# Patient Record
Sex: Female | Born: 1950
Health system: Southern US, Community
[De-identification: ages and names within clinical notes are randomized; demographics above are authoritative.]

## PROBLEM LIST (undated history)

## (undated) DIAGNOSIS — N8502 Endometrial intraepithelial neoplasia [EIN]: Secondary | ICD-10-CM

## (undated) DIAGNOSIS — R319 Hematuria, unspecified: Secondary | ICD-10-CM

## (undated) DIAGNOSIS — J45909 Unspecified asthma, uncomplicated: Secondary | ICD-10-CM

## (undated) DIAGNOSIS — E7800 Pure hypercholesterolemia, unspecified: Secondary | ICD-10-CM

## (undated) DIAGNOSIS — IMO0002 Reserved for concepts with insufficient information to code with codable children: Secondary | ICD-10-CM

## (undated) HISTORY — DX: Hematuria, unspecified: R31.9

## (undated) HISTORY — DX: Reserved for concepts with insufficient information to code with codable children: IMO0002

## (undated) HISTORY — DX: Pure hypercholesterolemia, unspecified: E78.00

## (undated) HISTORY — DX: Endometrial intraepithelial neoplasia (EIN): N85.02

## (undated) HISTORY — PX: TUBAL LIGATION: SHX77

## (undated) HISTORY — DX: Unspecified asthma, uncomplicated: J45.909

---

## 1970-01-11 HISTORY — PX: CHOLECYSTECTOMY: SHX55

## 2003-03-18 ENCOUNTER — Encounter: Admission: RE | Admit: 2003-03-18 | Discharge: 2003-03-18 | Payer: Self-pay | Admitting: Family Medicine

## 2003-05-15 ENCOUNTER — Other Ambulatory Visit: Admission: RE | Admit: 2003-05-15 | Discharge: 2003-05-15 | Payer: Self-pay | Admitting: Family Medicine

## 2003-08-14 ENCOUNTER — Ambulatory Visit (HOSPITAL_COMMUNITY): Admission: RE | Admit: 2003-08-14 | Discharge: 2003-08-14 | Payer: Self-pay | Admitting: Gastroenterology

## 2004-08-10 ENCOUNTER — Other Ambulatory Visit: Admission: RE | Admit: 2004-08-10 | Discharge: 2004-08-10 | Payer: Self-pay | Admitting: Internal Medicine

## 2004-08-27 ENCOUNTER — Encounter: Admission: RE | Admit: 2004-08-27 | Discharge: 2004-08-27 | Payer: Self-pay | Admitting: Family Medicine

## 2005-08-30 ENCOUNTER — Encounter: Admission: RE | Admit: 2005-08-30 | Discharge: 2005-08-30 | Payer: Self-pay | Admitting: Family Medicine

## 2005-09-16 ENCOUNTER — Other Ambulatory Visit: Admission: RE | Admit: 2005-09-16 | Discharge: 2005-09-16 | Payer: Self-pay | Admitting: Family Medicine

## 2006-09-01 ENCOUNTER — Encounter: Admission: RE | Admit: 2006-09-01 | Discharge: 2006-09-01 | Payer: Self-pay | Admitting: Family Medicine

## 2006-10-20 ENCOUNTER — Other Ambulatory Visit: Admission: RE | Admit: 2006-10-20 | Discharge: 2006-10-20 | Payer: Self-pay | Admitting: Family Medicine

## 2007-09-20 ENCOUNTER — Encounter: Admission: RE | Admit: 2007-09-20 | Discharge: 2007-09-20 | Payer: Self-pay | Admitting: Family Medicine

## 2007-11-06 ENCOUNTER — Other Ambulatory Visit: Admission: RE | Admit: 2007-11-06 | Discharge: 2007-11-06 | Payer: Self-pay | Admitting: Family Medicine

## 2007-11-22 ENCOUNTER — Encounter: Admission: RE | Admit: 2007-11-22 | Discharge: 2007-11-22 | Payer: Self-pay | Admitting: Family Medicine

## 2008-01-26 ENCOUNTER — Ambulatory Visit (HOSPITAL_BASED_OUTPATIENT_CLINIC_OR_DEPARTMENT_OTHER): Admission: RE | Admit: 2008-01-26 | Discharge: 2008-01-26 | Payer: Self-pay | Admitting: Orthopedic Surgery

## 2008-11-13 ENCOUNTER — Encounter: Admission: RE | Admit: 2008-11-13 | Discharge: 2008-11-13 | Payer: Self-pay | Admitting: Family Medicine

## 2009-01-31 ENCOUNTER — Other Ambulatory Visit: Admission: RE | Admit: 2009-01-31 | Discharge: 2009-01-31 | Payer: Self-pay | Admitting: Family Medicine

## 2009-10-11 HISTORY — PX: DILATION AND CURETTAGE OF UTERUS: SHX78

## 2009-11-04 ENCOUNTER — Ambulatory Visit
Admission: RE | Admit: 2009-11-04 | Discharge: 2009-11-04 | Payer: Self-pay | Source: Home / Self Care | Admitting: Obstetrics & Gynecology

## 2009-11-20 ENCOUNTER — Encounter: Admission: RE | Admit: 2009-11-20 | Discharge: 2009-11-20 | Payer: Self-pay | Admitting: Family Medicine

## 2009-11-26 ENCOUNTER — Ambulatory Visit
Admission: RE | Admit: 2009-11-26 | Discharge: 2009-11-26 | Payer: Self-pay | Source: Home / Self Care | Admitting: Gynecologic Oncology

## 2009-12-11 HISTORY — PX: LAPAROSCOPIC TOTAL HYSTERECTOMY: SUR800

## 2009-12-16 ENCOUNTER — Ambulatory Visit (HOSPITAL_COMMUNITY)
Admission: RE | Admit: 2009-12-16 | Discharge: 2009-12-17 | Payer: Self-pay | Source: Home / Self Care | Attending: Obstetrics & Gynecology | Admitting: Obstetrics & Gynecology

## 2009-12-16 ENCOUNTER — Encounter: Payer: Self-pay | Admitting: Obstetrics & Gynecology

## 2010-02-04 ENCOUNTER — Ambulatory Visit
Admission: RE | Admit: 2010-02-04 | Discharge: 2010-02-04 | Payer: Self-pay | Source: Home / Self Care | Attending: Gynecologic Oncology | Admitting: Gynecologic Oncology

## 2010-02-09 NOTE — Consult Note (Signed)
NAMEVICTORINA, KABLE               ACCOUNT NO.:  [id number]  MEDICAL RECORD NO.:  [id number]          PATIENT TYPE:  OUT  LOCATION:  GYN                          FACILITY:  [facility]  PHYSICIAN:  Fraidy Mccarrick A. Duard Brady, MD    DATE OF BIRTH:  [birth year]  DATE OF CONSULTATION: DATE OF DISCHARGE:                                CONSULTATION   HISTORY OF PRESENT ILLNESS:  Ms. Haley Thompson is a very pleasant, 60 year old who had been on hormone replacement therapy and began having some spotting.  Endometrial biopsy revealed simple and complex hyperplasia with atypia.  She subsequently underwent a robotic hysterectomy, BSO and pelvic lymph nodes on December 16, 2009.  Pathology revealed a superficially invasive endometrioid adenocarcinoma arising in the background of atypical complex hyperplasia confined to within the inner half of the myometrium.  There was lymphovascular space involvement. The cervix, tubes and ovaries were negative.  Zero out of 7 lymph nodes were involved.  In reviewing her pathologic factors, she is 60 years old with a grade 1 lesion, approximately 12% myometrial invasion and 0 out of 7 lymph nodes with negative washings.  While she does have lymphovascular space involvement, based on GOG-99, she only has one of the required two risk factors.  She and I reviewed that at length today.  She did have some back pain postoperatively, but she thinks it might have been just her abdominal pain referring itself.  She only needed to take some ibuprofen for her pain, and it has done really well.  She states that since her surgery, while she was initially feeling some symptoms of bladder relaxation and not a complete voiding, she says since the surgery she feels that her bladder has improved, and she has increased forced to her urinary stream. She did have some vaginal bleeding but has had just a minimal pink discharge.  She did have a fairly profound amount of peritoneal fluid that did come out  on postoperative day #1 and that resolved quite quickly.  She otherwise is without complaints.  PHYSICAL EXAMINATION:  Weight 145 pounds, blood pressure 120/64, pulse 72.  Well-nourished, well-developed female in no acute distress. ABDOMEN:  Well-healed surgical incisions.  Abdomen is soft and nontender. PELVIC:  External genitalia is within normal limits, slightly atrophic. The vagina is atrophic.  The vaginal cuff is visualized.  Vaginal cuff sutures are still seen.  Bimanual examination reveals normal postoperative changes.  There is no masses or nodularity.  ASSESSMENT:  A 60 year old with stage IA grade 1 endometrioid adenocarcinoma who is doing very well postoperatively.  PLAN:  Since the sutures are still seen, I advised her to refrain from intercourse another 2 weeks.  She can start increasing her usual other activities.  She will need to see [place] in 6 months and either alternate between Dr. Kevan Ny and Dr. Hyacinth Meeker every 6 months.  She will be seen twice a year for the next 5 years.  The follow-up plans were discussed with the patient, and her questions were elicited and answered to her satisfaction.  She is very pleased with how she has done and is very pleased with her  pathology report as are we.     Garvis Downum A. Duard Brady, MD     PAG/MEDQ  D:  02/04/2010  T:  02/04/2010  Job:  [id number]  cc:   Duncan Dull, M.D. Fax: [phone]  Telford Nab, R.N. [address] [place], [place] [address]  M. Leda Quail, MD Fax: [phone]  Electronically Signed by Cleda Mccreedy MD on 02/09/2010 02:26:27 PM

## 2010-03-24 LAB — CBC
HCT: 34.1 % — ABNORMAL LOW (ref 36.0–46.0)
HCT: 39.4 % (ref 36.0–46.0)
Hemoglobin: 11.5 g/dL — ABNORMAL LOW (ref 12.0–15.0)
Hemoglobin: 13.3 g/dL (ref 12.0–15.0)
MCH: 30.7 pg (ref 26.0–34.0)
MCH: 30.9 pg (ref 26.0–34.0)
MCHC: 33.7 g/dL (ref 30.0–36.0)
MCHC: 33.8 g/dL (ref 30.0–36.0)
MCV: 91.2 fL (ref 78.0–100.0)
MCV: 91.4 fL (ref 78.0–100.0)
Platelets: 174 10*3/uL (ref 150–400)
Platelets: 218 10*3/uL (ref 150–400)
RBC: 3.74 MIL/uL — ABNORMAL LOW (ref 3.87–5.11)
RBC: 4.31 MIL/uL (ref 3.87–5.11)
RDW: 12.5 % (ref 11.5–15.5)
RDW: 12.6 % (ref 11.5–15.5)
WBC: 6.1 10*3/uL (ref 4.0–10.5)
WBC: 7.6 10*3/uL (ref 4.0–10.5)

## 2010-03-24 LAB — COMPREHENSIVE METABOLIC PANEL
ALT: 25 U/L (ref 0–35)
AST: 26 U/L (ref 0–37)
Albumin: 4.1 g/dL (ref 3.5–5.2)
Alkaline Phosphatase: 70 U/L (ref 39–117)
BUN: 17 mg/dL (ref 6–23)
CO2: 26 mEq/L (ref 19–32)
Calcium: 9.5 mg/dL (ref 8.4–10.5)
Chloride: 109 mEq/L (ref 96–112)
Creatinine, Ser: 0.79 mg/dL (ref 0.4–1.2)
GFR calc Af Amer: >60 mL/min (ref >60)
GFR calc non Af Amer: >60 mL/min (ref >60)
Glucose, Bld: 96 mg/dL (ref 70–99)
Potassium: 4.3 mEq/L (ref 3.5–5.1)
Sodium: 141 mEq/L (ref 135–145)
Total Bilirubin: 1.2 mg/dL (ref 0.3–1.2)
Total Protein: 6.8 g/dL (ref 6.0–8.3)

## 2010-03-24 LAB — DIFFERENTIAL
Basophils Absolute: 0 10*3/uL (ref 0.0–0.1)
Basophils Relative: 1 % (ref 0–1)
Eosinophils Absolute: 0.2 10*3/uL (ref 0.0–0.7)
Eosinophils Relative: 3 % (ref 0–5)
Lymphocytes Relative: 39 % (ref 12–46)
Lymphs Abs: 2.4 10*3/uL (ref 0.7–4.0)
Monocytes Absolute: 0.5 10*3/uL (ref 0.1–1.0)
Monocytes Relative: 9 % (ref 3–12)
Neutro Abs: 3 10*3/uL (ref 1.7–7.7)
Neutrophils Relative %: 49 % (ref 43–77)

## 2010-03-24 LAB — TYPE AND SCREEN
ABO/RH(D): O POS
Antibody Screen: NEGATIVE

## 2010-03-24 LAB — SURGICAL PCR SCREEN: Staphylococcus aureus: POSITIVE — AB

## 2010-03-24 LAB — ABO/RH: ABO/RH(D): O POS

## 2010-03-25 LAB — POCT HEMOGLOBIN-HEMACUE: Hemoglobin: 13.5 g/dL (ref 12.0–15.0)

## 2010-04-27 LAB — POCT HEMOGLOBIN-HEMACUE: Hemoglobin: 13.8 g/dL (ref 12.0–15.0)

## 2010-04-30 ENCOUNTER — Other Ambulatory Visit: Payer: Self-pay | Admitting: Family Medicine

## 2010-04-30 DIAGNOSIS — M8580 Other specified disorders of bone density and structure, unspecified site: Secondary | ICD-10-CM

## 2010-05-21 ENCOUNTER — Ambulatory Visit
Admission: RE | Admit: 2010-05-21 | Discharge: 2010-05-21 | Disposition: A | Discharge status: Home / Self Care | Payer: BC Managed Care – PPO | Source: Ambulatory Visit | Attending: Family Medicine | Admitting: Family Medicine

## 2010-05-21 DIAGNOSIS — M8580 Other specified disorders of bone density and structure, unspecified site: Secondary | ICD-10-CM

## 2010-05-26 NOTE — Op Note (Signed)
NAMELEI, DOWER               ACCOUNT NO.:  [id number]   MEDICAL RECORD NO.:  [id number]          PATIENT TYPE:  AMB   LOCATION:  DSC                          FACILITY:  [facility]   PHYSICIAN:  Katy Fitch. Sypher, M.D. DATE OF BIRTH:  07-02-1950   DATE OF PROCEDURE:  01/26/2008  DATE OF DISCHARGE:                               OPERATIVE REPORT   PREOPERATIVE DIAGNOSIS:  Mucoid cyst, left small finger, dorsal ulnar  aspect with advanced degenerative arthritis of distal interphalangeal  joint.   POSTOPERATIVE DIAGNOSIS:  Mucoid cyst, left small finger, dorsal ulnar  aspect with advanced degenerative arthritis of distal interphalangeal  joint.   OPERATION:  1. Debridement of left small finger distal interphalangeal joint with      removal of marginal osteophytes on the middle phalangeal head and      the base of the distal phalanx followed by synovectomy and removal      of loose bodies.  2. Removal of subcutaneous mucoid cyst.   OPERATING SURGEON:  Katy Fitch. Sypher, MD   ASSISTANT:  Marveen Reeks Dasnoit, PA-C   ANESTHESIA:  General sedation and 2% lidocaine metacarpal head level  block, left small finger.   SUPERVISING ANESTHESIOLOGIST:  Kaylyn Layer. Michelle Piper, MD   INDICATIONS:  Haley Thompson is a 60 year old woman who is referred for  evaluation of an enlarging mucoid cyst.  Clinical examination revealed  signs of significant degenerative arthritis involving her left small  finger DIP joint with bone-on-bone arthropathy and a multilobular mucoid  cyst.   We advised her 2 months prior to consider joint debridement and cyst  excision.   After contemplating this predicament for several months, she elected to  proceed with joint debridement and cyst excision at this time.   PROCEDURE IN DETAIL:  Gerardo Territo was brought to the operating room  and placed in supine position on the operating table.   Following sedation, the right arm was prepped with Betadine soap and  solution and  sterilely draped.  A pneumatic tourniquet was not utilized,  rather a Penrose drain was placed on the finger.   After routine draping, the right small finger was exsanguinated with a  gauze wrap and a 1/4-inch Penrose drain was placed in the proximal  phalangeal segment as digital tourniquet.   Procedure commenced with curvilinear incisions exposing the dorsal  radial and dorsal ulnar aspects of the DIP joint.  Capsulotomies were  performed between the collateral ligaments and the terminal extensor  tendon slip.  Marginal osteophytes were debrided at the base of the  distal phalanx and the head of the middle phalanx, more notable on the  ulnar aspect than the radial aspect.  There was a large synovial mass  within the joint that was debrided with micro rongeur technique.  The  marginal osteophytes were then debrided removing the Heberden nodes  followed by irrigation of the joint with a 27-gauge needle.  The  subcutaneous cyst was then debrided thoroughly with a micro rongeur  removing the membrane and the mucinous contents.   Both wounds were then repaired with interrupted suture of 5-0  nylon.  There were no apparent complications.   [name] tolerated the surgery and anesthesia well.  She was  transferred to the recovery room with stable signs.  She will be  discharged to the care of her family with prescriptions for Vicodin 5 mg  1 p.o. q.4-6 h. p.r.n. pain, 20 tablets without refill and Keflex 500 mg  1 p.o. q.8 h. x4 days as a prophylactic antibiotic.      Katy Fitch Sypher, M.D.  Electronically Signed     RVS/MEDQ  D:  01/26/2008  T:  01/26/2008  Job:  [id number]

## 2010-05-29 NOTE — Op Note (Signed)
NAMEGILLIAN, Haley Thompson                           ACCOUNT NO.:  [id number]   MEDICAL RECORD NO.:  [id number]                   PATIENT TYPE:  AMB   LOCATION:  ENDO                                 FACILITY:  [facility]   PHYSICIAN:  John C. Madilyn Fireman, M.D.                 DATE OF BIRTH:  1950-01-14   DATE OF PROCEDURE:  08/14/2003  DATE OF DISCHARGE:                                 OPERATIVE REPORT   PROCEDURE:  Colonoscopy.   INDICATIONS FOR PROCEDURE:  Average-risk colon cancer screening in a 60-year-  old patient with no previous screening.   PROCEDURE:  The patient was placed in the left lateral decubitus position  and placed on the pulse monitor with continuous low flow oxygen delivered by  nasal cannula.  She was sedated with 62.5 mcg IV fentanyl and 6 mg IV  Versed.  The Olympus video colonoscope is inserted into the rectum and  advanced to the cecum, confirmed by transillumination at McBurney's point  and visualization of the ileocecal valve and appendiceal orifice.  Prep is  excellent.  The cecum, ascending, transverse, descending, and sigmoid colon  all appeared normal with no masses, polyps, diverticula, or other mucosal  abnormalities.  The rectum likewise appeared normal, and retroflexed view of  the anus revealed no obvious internal hemorrhoids.  The scope was then  withdrawn, and the patient returned to the recovery room in stable  condition.  She tolerated the procedure well, and there were no immediate  complications.   IMPRESSION:  Normal colonoscopy.   PLAN:  A sigmoidoscopy in five years.                                               John C. Madilyn Fireman, M.D.    JCH/MEDQ  D:  08/14/2003  T:  08/14/2003  Job:  [id number]   cc:   Duncan Dull, M.D.  [address]  [place]  [place] [address]  Fax: [phone]

## 2010-07-29 ENCOUNTER — Ambulatory Visit: Payer: BC Managed Care – PPO | Attending: Gynecologic Oncology | Admitting: Gynecologic Oncology

## 2010-07-29 DIAGNOSIS — N8502 Endometrial intraepithelial neoplasia [EIN]: Secondary | ICD-10-CM | POA: Insufficient documentation

## 2010-07-29 DIAGNOSIS — C549 Malignant neoplasm of corpus uteri, unspecified: Secondary | ICD-10-CM | POA: Insufficient documentation

## 2010-07-31 NOTE — Consult Note (Signed)
NAMEJOELLE, ROSWELL               ACCOUNT NO.:  [id number]  MEDICAL RECORD NO.:  [id number]  LOCATION:  GYN                          FACILITY:  [facility]  PHYSICIAN:  Brittnie Lewey A. Duard Brady, MD    DATE OF BIRTH:  1950-06-07  DATE OF CONSULTATION:  07/29/2010 DATE OF DISCHARGE:                                CONSULTATION   HISTORY OF PRESENT ILLNESS:  Ms. Garlock is a very pleasant 60 year old who had an endometrial biopsy after having some postmenopausal bleeding that revealed complex hyperplasia with atypia.  She underwent robotic hysterectomy, BSO and pelvic lymph node dissection in December 2011. Pathology revealed superficially invasive endometrioid adenocarcinoma arising in the background of hyperplasia with no obvious site and negative lymph nodes.  She was therefore a stage IA grade 1 endometrioid adenocarcinoma, disposition to close followup.  She comes in today for followup.   She states that she is overall doing quite well.  She continues to have a little bit of bladder issues.  Initially she felt that her bladder issues were much better after surgery but they have returned and she is going to be seeing Dr. Hyacinth Meeker to discuss options. She states she is not really sleeping well.  She was prescribed some Ambien by Dr. Kevan Ny.  She is trying not to use that.  She is walking about 2 miles per day and in retrospect when we talked about this, she states that the day she exercises she does feel that she sleeps better and now that she is retired she states there is really no reason she could not exercise more frequently to see if that helps with her sleep patterns.  She also admits to fairly poor sleep hygiene and that she takes catnaps or stays up too late now that she does not have the regimen of waking up to go to work.  She denies any change in bowel or bladder; any vaginal bleeding; any nausea, vomiting, fevers, chills, headaches or visual changes.  MEDICATION:  Medication list is  reviewed and is unchanged; includes Lipitor, Ambien p.r.n. and a baby aspirin.  HEALTH MAINTENANCE:  She is up-to-date on her mammograms.  PHYSICAL EXAMINATION:  VITAL SIGNS:  Weight 142 pounds, blood pressure 118/60, pulse 72, respirations 20, temperature 97.7. GENERAL:  A well-nourished, well-developed female, in no acute distress. NECK:  Supple.  There is no lymphadenopathy.  No thyromegaly. LUNGS:  Clear to auscultation bilaterally. CARDIOVASCULAR:  Regular rate and rhythm. ABDOMEN:  Soft, nontender, nondistended.  There are no palpable masses or hepatosplenomegaly.  Groins are negative for adenopathy. EXTREMITIES:  There is no edema. PELVIC:  External genitalia is within normal limits.  The vagina is slightly atrophic.  There is some descensus.  The vaginal cuff is without lesions.  Bimanual examination reveals no masses or nodularity. Rectal confirms.  ASSESSMENT:  This is a 60 year old with stage IA grade 1 endometrioid adenocarcinoma who is doing very well at her first surveillance visit.  PLAN:  She will follow up with Dr. Hyacinth Meeker in 6 months and she will have a Pap smear at that time.  I discussed with her that we will do Pap smear on a yearly visit.  She will be  seeing every 6 months.  She will return to see me in 1 year's time.     Alese Furniss A. Duard Brady, MD     PAG/MEDQ  D:  07/29/2010  T:  07/29/2010  Job:  [id number]  cc:   Duncan Dull, M.D. Fax: [phone]  Telford Nab, R.N. [address] [place], [place] [address]  M. Leda Quail, MD Fax: [phone]  Electronically Signed by Cleda Mccreedy MD on 07/31/2010 10:12:51 AM

## 2010-11-11 ENCOUNTER — Other Ambulatory Visit: Payer: Self-pay | Admitting: Family Medicine

## 2010-11-11 DIAGNOSIS — Z1231 Encounter for screening mammogram for malignant neoplasm of breast: Secondary | ICD-10-CM

## 2011-01-14 ENCOUNTER — Ambulatory Visit
Admission: RE | Admit: 2011-01-14 | Discharge: 2011-01-14 | Disposition: A | Discharge status: Home / Self Care | Payer: BC Managed Care – PPO | Source: Ambulatory Visit | Attending: Family Medicine | Admitting: Family Medicine

## 2011-01-14 DIAGNOSIS — Z1231 Encounter for screening mammogram for malignant neoplasm of breast: Secondary | ICD-10-CM

## 2011-07-21 ENCOUNTER — Encounter: Payer: Self-pay | Admitting: Gynecologic Oncology

## 2011-07-28 ENCOUNTER — Other Ambulatory Visit (HOSPITAL_COMMUNITY)
Admission: RE | Admit: 2011-07-28 | Discharge: 2011-07-28 | Disposition: A | Discharge status: Home / Self Care | Payer: BC Managed Care – PPO | Source: Ambulatory Visit | Attending: Gynecologic Oncology | Admitting: Gynecologic Oncology

## 2011-07-28 ENCOUNTER — Encounter: Payer: Self-pay | Admitting: Gynecologic Oncology

## 2011-07-28 ENCOUNTER — Ambulatory Visit: Payer: BC Managed Care – PPO | Attending: Gynecologic Oncology | Admitting: Gynecologic Oncology

## 2011-07-28 VITALS — BP 106/70 | HR 64 | Temp 97.5°F | Resp 16 | Ht 64.17 in | Wt 151.0 lb

## 2011-07-28 DIAGNOSIS — C541 Malignant neoplasm of endometrium: Secondary | ICD-10-CM | POA: Insufficient documentation

## 2011-07-28 DIAGNOSIS — Z124 Encounter for screening for malignant neoplasm of cervix: Secondary | ICD-10-CM | POA: Insufficient documentation

## 2011-07-28 DIAGNOSIS — Z7982 Long term (current) use of aspirin: Secondary | ICD-10-CM | POA: Insufficient documentation

## 2011-07-28 DIAGNOSIS — E7800 Pure hypercholesterolemia, unspecified: Secondary | ICD-10-CM | POA: Insufficient documentation

## 2011-07-28 DIAGNOSIS — Z833 Family history of diabetes mellitus: Secondary | ICD-10-CM | POA: Insufficient documentation

## 2011-07-28 DIAGNOSIS — Z01419 Encounter for gynecological examination (general) (routine) without abnormal findings: Secondary | ICD-10-CM | POA: Insufficient documentation

## 2011-07-28 DIAGNOSIS — Z803 Family history of malignant neoplasm of breast: Secondary | ICD-10-CM | POA: Insufficient documentation

## 2011-07-28 DIAGNOSIS — Z90710 Acquired absence of both cervix and uterus: Secondary | ICD-10-CM | POA: Insufficient documentation

## 2011-07-28 DIAGNOSIS — Z8249 Family history of ischemic heart disease and other diseases of the circulatory system: Secondary | ICD-10-CM | POA: Insufficient documentation

## 2011-07-28 DIAGNOSIS — Z8542 Personal history of malignant neoplasm of other parts of uterus: Secondary | ICD-10-CM | POA: Insufficient documentation

## 2011-07-28 DIAGNOSIS — Z79890 Hormone replacement therapy (postmenopausal): Secondary | ICD-10-CM | POA: Insufficient documentation

## 2011-07-28 NOTE — Progress Notes (Signed)
Consult Note: Gyn-Onc  Haley Thompson 61 y.o. female  CC:  Chief Complaint  Patient presents with  . Cancer    HPI: HISTORY OF PRESENT ILLNESS: Haley Thompson is a very pleasant, 61 year old who had been on hormone replacement therapy and began having some spotting. Endometrial biopsy revealed simple and complex hyperplasia with atypia. She subsequently underwent a robotic hysterectomy, BSO and pelvic lymph nodes on December 16, 2009. Pathology revealed a superficially invasive endometrioid adenocarcinoma arising in the  background of atypical complex hyperplasia confined to within the inner half of the myometrium. There was lymphovascular space involvement. The cervix, tubes and ovaries were negative. Zero out of 7 lymph nodes  were involved. In reviewing her pathologic factors, she was 61 years old at the time of diagnosis with a grade 1 lesion, approximately 12% myometrial invasion and 0 out of 7 lymph nodes with negative washings. While she does have lymphovascular space involvement, based on GOG-99, she only has one of the required two risk factors. She and I reviewed that at length at her post operative visit. We last saw her 7/12.  Interval History:  She is doing very well. She saw Dr. Hyacinth Meeker 6 months ago and had a negative examination and Pap smear. She is also up-to-date on her mammograms. She is followed regularly by Dr. Kevan Ny. She is sleeping better now that she is in regular sleep schedule. She only requires her Ambien about one to 2 times per week. She has been drinking more water on less so than coffee and that might be helping with her sleeping pattern. Dr. Hyacinth Meeker also has her seen a urologist undergoing biofeedback and some physical therapy exercises to help with her sense of urinary urgency and urge incontinence. With regards to family history up, her older brother who turned 2 this year has had some stents placed for coronary artery disease he is also had a stroke. He is a  smoker.  Review of Systems: She denies any nausea, vomiting, fevers, chills, chest pain, shortness of breath. She denies any vaginal discharge or change in her bowel or bladder habits other than mentioned above. She denies any abdominal or pelvic pain. Review of systems x10 systems is otherwise negative. Current Meds:  Outpatient Encounter Prescriptions as of 07/28/2011  Medication Sig Dispense Refill  . aspirin 81 MG tablet Take 81 mg by mouth daily.      . Atorvastatin Calcium (LIPITOR PO) Take by mouth.      . Zolpidem Tartrate (AMBIEN PO) Take by mouth as needed.        Allergy: No Known Allergies  Social Hx:   History   Social History  . Marital Status: Married    Spouse Name: N/A    Number of Children: N/A  . Years of Education: N/A   Occupational History  . Not on file.   Social History Main Topics  . Smoking status: Not on file  . Smokeless tobacco: Not on file  . Alcohol Use: Not on file  . Drug Use: Not on file  . Sexually Active: Not on file   Other Topics Concern  . Not on file   Social History Narrative  . No narrative on file    Past Surgical Hx:  Past Surgical History  Procedure Date  . Abdominal hysterectomy 12/2009    Robotic, BSO, pelvic LND  . Cholecystectomy 1972    Open  . Tubal ligation   . Dilation and curettage of uterus 10/2009    Past Medical  Hx:  Past Medical History  Diagnosis Date  . Postmenopausal bleeding   . Complex endometrial hyperplasia with atypia   . Endometrioid adenocarcinoma     stage IA, grade 1  . Hypercholesterolemia     Family Hx:  Family History  Problem Relation Age of Onset  . Heart attack Mother   . Hypertension Father   . Hypertension Sister   . Coronary artery disease Sister   . Hypertension Brother   . Coronary artery disease Brother   . Diabetes Maternal Aunt   . Breast cancer Paternal Aunt   . Coronary artery disease Maternal Grandfather   . Diabetes Paternal Grandmother     Vitals:  There  were no vitals taken for this visit.  Physical Exam: Well-nourished, well-developed female in no acute distress.  Neck: Supple, no lymphadenopathy, no thyromegaly.  Cardiovascular: Regular rate and rhythm.  Abdomen: Soft, nontender, nondistended. There are no palpable masses or hepatosplenomegaly. There is no evidence of any incisional hernias.  Extremities: No edema.  Pelvic: Normal external female genitalia. The vagina is atrophic. The vaginal cuff is visualized are no visible lesions. ThinPrep Pap was submitted without difficulty. Bimanual examination reveals no masses or nodularity. Rectal confirms.  Assessment/Plan:  61 year old with stage IA grade 1 endometrioid adenocarcinoma diagnosed and treated 1/12 who we last saw 7/12. We will followup in results for Pap smear from today. She will see Dr. Hyacinth Meeker in 6 months in followup with Dr. Kevan Ny has scheduled.    Miia Blanks A., MD 07/28/2011, 9:24 AM

## 2011-07-28 NOTE — Patient Instructions (Signed)
RTC one year

## 2011-08-03 ENCOUNTER — Telehealth: Payer: Self-pay | Admitting: Gynecologic Oncology

## 2011-08-03 NOTE — Telephone Encounter (Signed)
Message left for patient with pap smear results: negative.  Instructed to call for any questions or concerns.  

## 2012-01-26 ENCOUNTER — Other Ambulatory Visit (HOSPITAL_COMMUNITY): Payer: Self-pay | Admitting: Family Medicine

## 2012-01-26 DIAGNOSIS — Z1231 Encounter for screening mammogram for malignant neoplasm of breast: Secondary | ICD-10-CM

## 2012-03-08 ENCOUNTER — Other Ambulatory Visit: Payer: Self-pay | Admitting: Family Medicine

## 2012-03-08 DIAGNOSIS — R109 Unspecified abdominal pain: Secondary | ICD-10-CM

## 2012-03-09 ENCOUNTER — Ambulatory Visit
Admission: RE | Admit: 2012-03-09 | Discharge: 2012-03-09 | Disposition: A | Discharge status: Home / Self Care | Payer: BC Managed Care – PPO | Source: Ambulatory Visit | Attending: Family Medicine | Admitting: Family Medicine

## 2012-03-09 DIAGNOSIS — R109 Unspecified abdominal pain: Secondary | ICD-10-CM

## 2012-03-09 MED ORDER — IOHEXOL 300 MG/ML  SOLN
100.0000 mL | Freq: Once | INTRAMUSCULAR | Status: AC | PRN
  Administered 2012-03-09: 100 mL via INTRAVENOUS

## 2012-03-16 ENCOUNTER — Ambulatory Visit (HOSPITAL_COMMUNITY): Payer: BC Managed Care – PPO

## 2012-03-17 ENCOUNTER — Other Ambulatory Visit (HOSPITAL_COMMUNITY): Payer: Self-pay | Admitting: Family Medicine

## 2012-03-17 DIAGNOSIS — Z1231 Encounter for screening mammogram for malignant neoplasm of breast: Secondary | ICD-10-CM

## 2012-03-30 ENCOUNTER — Ambulatory Visit (HOSPITAL_COMMUNITY): Payer: BC Managed Care – PPO

## 2012-03-30 ENCOUNTER — Ambulatory Visit (HOSPITAL_COMMUNITY)
Admission: RE | Admit: 2012-03-30 | Discharge: 2012-03-30 | Disposition: A | Discharge status: Home / Self Care | Payer: BC Managed Care – PPO | Source: Ambulatory Visit | Attending: Family Medicine | Admitting: Family Medicine

## 2012-03-30 DIAGNOSIS — Z1231 Encounter for screening mammogram for malignant neoplasm of breast: Secondary | ICD-10-CM

## 2012-09-14 ENCOUNTER — Ambulatory Visit: Payer: BC Managed Care – PPO | Admitting: Gynecologic Oncology

## 2012-09-21 ENCOUNTER — Ambulatory Visit: Payer: BC Managed Care – PPO | Attending: Gynecologic Oncology | Admitting: Gynecologic Oncology

## 2012-09-21 ENCOUNTER — Encounter: Payer: Self-pay | Admitting: Gynecologic Oncology

## 2012-09-21 VITALS — BP 119/79 | HR 78 | Temp 98.6°F | Resp 16

## 2012-09-21 DIAGNOSIS — Z79899 Other long term (current) drug therapy: Secondary | ICD-10-CM | POA: Insufficient documentation

## 2012-09-21 DIAGNOSIS — C549 Malignant neoplasm of corpus uteri, unspecified: Secondary | ICD-10-CM | POA: Insufficient documentation

## 2012-09-21 DIAGNOSIS — E7800 Pure hypercholesterolemia, unspecified: Secondary | ICD-10-CM | POA: Insufficient documentation

## 2012-09-21 DIAGNOSIS — Z9079 Acquired absence of other genital organ(s): Secondary | ICD-10-CM | POA: Insufficient documentation

## 2012-09-21 DIAGNOSIS — C541 Malignant neoplasm of endometrium: Secondary | ICD-10-CM

## 2012-09-21 DIAGNOSIS — Z90710 Acquired absence of both cervix and uterus: Secondary | ICD-10-CM | POA: Insufficient documentation

## 2012-09-21 NOTE — Patient Instructions (Addendum)
Follow up as scheduled.  

## 2012-09-21 NOTE — Progress Notes (Signed)
Consult Note: Gyn-Onc  Haley Thompson 62 y.o. female  CC:  Chief Complaint  Patient presents with  . Endo Ca    HPI: Haley Thompson is a very pleasant, 62 year old who had been on hormone replacement therapy and began having some spotting. Endometrial biopsy revealed simple and complex hyperplasia with atypia. She subsequently underwent a robotic hysterectomy, BSO and pelvic lymph nodes on December 16, 2009. Pathology revealed a superficially invasive endometrioid adenocarcinoma arising in the background of atypical complex hyperplasia confined to within the inner half of the myometrium. There was lymphovascular space involvement. The cervix, tubes and ovaries were negative. Zero out of 7 lymph nodes   were involved. In reviewing her pathologic factors, she was 62 years old at the time of diagnosis with a grade 1 lesion, approximately 12% myometrial invasion and 0 out of 7 lymph nodes with negative washings. While she does have lymphovascular space involvement, based on GOG-99, she only has one of the required two risk factors. She and I reviewed that at length at her post operative visit. We last saw her 7/13.   Interval History:  She is doing very well. She saw Haley Thompson associate 6 months ago and had a negative examination and Pap smear. She is also up-to-date on her mammograms (3/14). She is followed regularly by Haley Thompson. She is sleeping better now that she is in regular sleep schedule. She only requires her Ambien about one to 2 times per week.Haley Thompson also had her see a urologist undergoing biofeedback and some physical therapy exercises to help with her sense of urinary urgency and urge incontinence. This is markedly better and she no longer has any stress urinary incontinence symptoms and only some urge incontinence. She was having some left lower quadrant pain when she was seen in Haley Thompson office. She followed up with Haley Thompson and had a CT scan in February of this year that was negative  for any evidence of metastatic disease or recurrence. This helped her symptoms could be related to diverticulosis. She is a colonoscopy due later this year. There were no new medical problems and her family.   Review of Systems:  Constitutional: Denies fever. Skin: No rash, sores, jaundice, itching, or dryness.  Cardiovascular: No chest pain, shortness of breath, or edema  Pulmonary: No cough or wheeze.  Gastro Intestinal: Reporting intermittent lower abdominal soreness on left as above.  No nausea, vomiting, constipation, or diarrhea reported. No bright red blood per rectum or change in bowel movement.  Genitourinary: No frequency, urgency, or dysuria.  Denies vaginal bleeding and discharge.  Musculoskeletal: No myalgia, arthralgia, joint swelling or pain.  Neurologic: No weakness, numbness, or change in gait.  Psychology: Sleep issues persist but are improved needing ambien only 1 time per week.   Current Meds:  Outpatient Encounter Prescriptions as of 09/21/2012  Medication Sig Dispense Refill  . aspirin 81 MG tablet Take 81 mg by mouth daily.      . Atorvastatin Calcium (LIPITOR PO) Take by mouth.      . Zolpidem Tartrate (AMBIEN PO) Take by mouth as needed.       No facility-administered encounter medications on file as of 09/21/2012.    Allergy: No Known Allergies  Social Hx:   History   Social History  . Marital Status: Married    Spouse Name: N/A    Number of Children: N/A  . Years of Education: N/A   Occupational History  . Not on file.   Social History  Main Topics  . Smoking status: Never Smoker   . Smokeless tobacco: Not on file  . Alcohol Use: No  . Drug Use: No  . Sexual Activity: Yes   Other Topics Concern  . Not on file   Social History Narrative  . No narrative on file    Past Surgical Hx:  Past Surgical History  Procedure Laterality Date  . Abdominal hysterectomy  12/2009    Robotic, BSO, pelvic LND  . Cholecystectomy  1972    Open  . Tubal  ligation    . Dilation and curettage of uterus  10/2009    Past Medical Hx:  Past Medical History  Diagnosis Date  . Postmenopausal bleeding   . Complex endometrial hyperplasia with atypia   . Endometrioid adenocarcinoma     stage IA, grade 1  . Hypercholesterolemia     Oncology Hx:    Endometrial adenocarcinoma   12/16/2009 Surgery Robotic hysterctomy, BSO and pelvic LND. Ia grade 1    Family Hx:  Family History  Problem Relation Age of Onset  . Heart attack Mother   . Hypertension Father   . Hypertension Sister   . Coronary artery disease Sister   . Hypertension Brother   . Coronary artery disease Brother   . Diabetes Maternal Aunt   . Breast cancer Paternal Aunt   . Coronary artery disease Maternal Grandfather   . Diabetes Paternal Grandmother     Vitals:  Blood pressure 119/79, pulse 78, temperature 98.6 F (37 C), temperature source Oral, resp. rate 16.  Physical Exam: Well-nourished, well-developed female in no acute distress.   Neck: Supple, no lymphadenopathy, no thyromegaly.   Cardiovascular: Regular rate and rhythm.   Lungs: Regular rate and rhythm.  Abdomen: Soft, nontender, nondistended. There are no palpable masses or hepatosplenomegaly. There is no evidence of any incisional hernias.   Extremities: No edema.   Pelvic: Normal external female genitalia. The vagina is atrophic. The vaginal cuff is visualized are no visible lesions. Bimanual examination reveals no masses or nodularity. Rectal confirms.   Assessment/Plan:  62 year old with stage IA grade 1 endometrioid adenocarcinoma diagnosed and treated 1/12 who we last saw 7/13. She has no evidence of disease on exam. She also had a CT scan in February of this year that was negative for metastatic disease. She will see Haley Thompson in 6 months in followup with Haley Thompson has scheduled.     Haley Thompson A., MD 09/21/2012, 9:31 AM

## 2012-09-22 ENCOUNTER — Other Ambulatory Visit: Payer: Self-pay

## 2013-03-05 ENCOUNTER — Encounter: Payer: Self-pay | Admitting: Obstetrics & Gynecology

## 2013-03-08 ENCOUNTER — Ambulatory Visit: Payer: Self-pay | Admitting: Obstetrics & Gynecology

## 2013-03-12 ENCOUNTER — Other Ambulatory Visit: Payer: Self-pay

## 2013-03-12 DIAGNOSIS — Z1231 Encounter for screening mammogram for malignant neoplasm of breast: Secondary | ICD-10-CM

## 2013-04-05 ENCOUNTER — Ambulatory Visit
Admission: RE | Admit: 2013-04-05 | Discharge: 2013-04-05 | Disposition: A | Discharge status: Home / Self Care | Payer: Self-pay | Source: Ambulatory Visit

## 2013-04-05 DIAGNOSIS — Z1231 Encounter for screening mammogram for malignant neoplasm of breast: Secondary | ICD-10-CM

## 2013-04-09 ENCOUNTER — Encounter: Payer: Self-pay | Admitting: Obstetrics & Gynecology

## 2013-04-09 ENCOUNTER — Ambulatory Visit (INDEPENDENT_AMBULATORY_CARE_PROVIDER_SITE_OTHER): Payer: BC Managed Care – PPO | Admitting: Obstetrics & Gynecology

## 2013-04-09 VITALS — BP 104/68 | HR 60 | Resp 12 | Ht 63.75 in | Wt 154.2 lb

## 2013-04-09 DIAGNOSIS — Z124 Encounter for screening for malignant neoplasm of cervix: Secondary | ICD-10-CM

## 2013-04-09 DIAGNOSIS — Z01419 Encounter for gynecological examination (general) (routine) without abnormal findings: Secondary | ICD-10-CM

## 2013-04-09 NOTE — Progress Notes (Signed)
63 y.o. G3P2 MarriedCaucasianF here for annual exam.  Doing well.  Husband planning on retiring in a year.  Goes to [place] every year and stays a week.  H/O TLH/BSO/LND 12/11 due to Grade 1 Adenocarcinoma of uterus.  Dr. Alycia Rossetti did her surgery.  No vaginal bleeding.    Saw Ileana Roup for incontinence and PT has really helped.    Sees Dr. Inda Merlin yearly.  She does all of her blood work.  Has appt in April.  Patient's last menstrual period was 01/11/1998.          Sexually active: yes  The current method of family planning is status post hysterectomy.    Exercising: yes  walking Smoker:  no  Health Maintenance: Pap:  03/07/12 WNL/negative HR HPV History of abnormal Pap:  Yes h/o IA grade I, adenoca of uterus MMG:  04/05/13 normal  Colonoscopy:  2005-repeat in 10 years, Dr. Inda Merlin will set up this for patient BMD:   2011 TDaP:  Up to date Screening Labs: PCP, Hb today: PCP, Urine today: PCP   reports that she has never smoked. She has never used smokeless tobacco. She reports that she does not drink alcohol or use illicit drugs.  Past Medical History  Diagnosis Date  . Postmenopausal bleeding   . Complex endometrial hyperplasia with atypia   . Endometrioid adenocarcinoma     stage IA, grade 1  . Hypercholesterolemia   . Asthma     mild    Past Surgical History  Procedure Laterality Date  . Abdominal hysterectomy  12/2009    Robotic, BSO, pelvic LND  . Cholecystectomy  1972    Open  . Tubal ligation    . Dilation and curettage of uterus  10/2009    Current Outpatient Prescriptions  Medication Sig Dispense Refill  . aspirin 81 MG tablet Take 81 mg by mouth daily.      . Atorvastatin Calcium (LIPITOR PO) Take 20 mg by mouth.       . B Complex Vitamins (VITAMIN B COMPLEX PO) Take by mouth daily.      Marland Kitchen BIOTIN PO Take by mouth.      . Probiotic Product ([place]) Take by mouth.      . Zolpidem Tartrate (AMBIEN PO) Take by mouth as needed.      . Albuterol  Sulfate (PROAIR HFA IN) Inhale into the lungs.      . Ciclopirox (PENLAC EX) Apply topically.       No current facility-administered medications for this visit.    Family History  Problem Relation Age of Onset  . Heart attack Mother   . Hypertension Father   . Hypertension Sister   . Coronary artery disease Sister   . Hypertension Brother   . Coronary artery disease Brother   . Diabetes Maternal Aunt   . Breast cancer Paternal Aunt   . Coronary artery disease Maternal Grandfather   . Diabetes Paternal Grandmother   . Diabetes Maternal Grandfather   . Diabetes Maternal Grandmother   . Diabetes Brother   . Depression Maternal Grandmother     ROS:  Pertinent items are noted in HPI.  Otherwise, a comprehensive ROS was negative.  Exam:   BP 104/68  Pulse 60  Resp 12  Ht 5' 3.75" (1.619 m)  Wt 154 lb 3.2 oz (69.945 kg)  BMI 26.68 kg/m2  LMP 01/11/1998  Weight change: stable  Height: 5' 3.75" (161.9 cm)  Ht Readings from Last 3 Encounters:  04/09/13 5' 3.75" (1.619 m)  07/28/11 5' 4.17" (1.63 m)    General appearance: alert, cooperative and appears stated age Head: Normocephalic, without obvious abnormality, atraumatic Neck: no adenopathy, supple, symmetrical, trachea midline and thyroid normal to inspection and palpation Lungs: clear to auscultation bilaterally Breasts: normal appearance, no masses or tenderness Heart: regular rate and rhythm Abdomen: soft, non-tender; bowel sounds normal; no masses,  no organomegaly Extremities: extremities normal, atraumatic, no cyanosis or edema Skin: Skin color, texture, turgor normal. No rashes or lesions Lymph nodes: Cervical, supraclavicular, and axillary nodes normal. No abnormal inguinal nodes palpated Neurologic: Grossly normal   Pelvic: External genitalia:  no lesions              Urethra:  normal appearing urethra with no masses, tenderness or lesions              Bartholins and Skenes: normal                 Vagina:  normal appearing vagina with normal color and discharge, no lesions              Cervix: absent              Pap taken: yes Bimanual Exam:  Uterus:  uterus absent              Adnexa: no mass, fullness, tenderness               Rectovaginal: Confirms               Anus:  normal sphincter tone, no lesions  A:  Well Woman with normal exam H/O Grade I Adenocarcinoma of endometrium, s/p TLH/BSO/LND 12/11 with Dr. Alycia Rossetti Cystocele with mild SUI--has done well since seeing Ileana Roup.  No longer interested in surgery.  P:   Mammogram yearly. pap smear today. Seeing Dr. Inda Merlin in April. return annually or prn  An After Visit Summary was printed and given to the patient.

## 2013-04-11 LAB — IPS PAP SMEAR ONLY

## 2013-11-12 ENCOUNTER — Encounter: Payer: Self-pay | Admitting: Obstetrics & Gynecology

## 2013-11-29 ENCOUNTER — Encounter: Payer: Self-pay | Admitting: Gynecologic Oncology

## 2013-11-29 ENCOUNTER — Ambulatory Visit: Payer: Medicare PPO | Attending: Gynecologic Oncology | Admitting: Gynecologic Oncology

## 2013-11-29 VITALS — BP 113/80 | HR 76 | Temp 97.8°F | Resp 16 | Ht 63.75 in | Wt 152.1 lb

## 2013-11-29 DIAGNOSIS — Z79899 Other long term (current) drug therapy: Secondary | ICD-10-CM | POA: Insufficient documentation

## 2013-11-29 DIAGNOSIS — Z90710 Acquired absence of both cervix and uterus: Secondary | ICD-10-CM | POA: Diagnosis not present

## 2013-11-29 DIAGNOSIS — Z9079 Acquired absence of other genital organ(s): Secondary | ICD-10-CM | POA: Insufficient documentation

## 2013-11-29 DIAGNOSIS — C541 Malignant neoplasm of endometrium: Secondary | ICD-10-CM

## 2013-11-29 DIAGNOSIS — K219 Gastro-esophageal reflux disease without esophagitis: Secondary | ICD-10-CM | POA: Insufficient documentation

## 2013-11-29 DIAGNOSIS — E780 Pure hypercholesterolemia: Secondary | ICD-10-CM | POA: Diagnosis not present

## 2013-11-29 DIAGNOSIS — Z90722 Acquired absence of ovaries, bilateral: Secondary | ICD-10-CM | POA: Diagnosis not present

## 2013-11-29 DIAGNOSIS — Z8542 Personal history of malignant neoplasm of other parts of uterus: Secondary | ICD-10-CM

## 2013-11-29 DIAGNOSIS — Z7982 Long term (current) use of aspirin: Secondary | ICD-10-CM | POA: Diagnosis not present

## 2013-11-29 NOTE — Progress Notes (Signed)
Consult Note: Gyn-Onc  Haley Thompson 63 y.o. female  CC:  Chief Complaint  Patient presents with  . endometrial adenocarcinoma    HPI: Haley Thompson is a very pleasant, 63 year old who had been on hormone replacement therapy and began having some spotting. Endometrial biopsy revealed simple and complex hyperplasia with atypia. She subsequently underwent a robotic hysterectomy, BSO and pelvic lymph nodes on December 16, 2009. Pathology revealed a superficially invasive endometrioid adenocarcinoma arising in the background of atypical complex hyperplasia confined to within the inner half of the myometrium. There was lymphovascular space involvement. The cervix, tubes and ovaries were negative. Zero out of 7 lymph nodes were involved.  In reviewing her pathologic factors, she was 63 years old at the time of diagnosis with a grade 1 lesion, approximately 12% myometrial invasion and 0 out of 7 lymph nodes with negative washings. While she does have lymphovascular space involvement, based on GOG-99, she only has one of the required two risk factors.  We last saw her 63.   Interval History:  She is doing very well. She saw Dr. Sabra Heck 6 months ago and had a negative examination and Pap smear. She is also up-to-date on her mammograms (3/14). She is followed regularly by Dr. Inda Merlin. She had a colonoscopy about 4 weeks ago was told she did not need another colonoscopy for 10 years. Left-sided pain is decreased significantly. We have discussed this last year. Of note she had a negative CT scan. With a negative colonoscopy is. It is most likely related to her diet. She was encouraged to take marrow laxative every day as she does have some issues with constipation with the Lipitor. She is having an increase in her acid reflux and is also making dietary modifications for that. She has lost about 3 pounds over the past year but that is been recent and is been intentional. 65 of her brothers have been diagnosed with  diabetes. One has insulin requiring diabetes. One is managing it with diet. She is up about 10 pounds from her presurgery weight and she is trying to get back to her baseline weight. She is otherwise without complaints.  Review of Systems:  Constitutional: Denies fever. Skin: No rash Cardiovascular: No chest pain, shortness of breath, or edema  Pulmonary: No cough or wheeze.  Gastro Intestinal: No nausea, vomiting, occ constipation, or diarrhea reported. No bright red blood per rectum or change in bowel movement.  Genitourinary: No frequency, urgency, or dysuria.  Denies vaginal bleeding and discharge.  Musculoskeletal: No myalgia, arthralgia, joint swelling or pain.  Neurologic: No weakness Psychology: No changes  Current Meds:  Outpatient Encounter Prescriptions as of 11/29/2013  Medication Sig  . Albuterol Sulfate (PROAIR HFA IN) Inhale into the lungs.  Marland Kitchen aspirin 81 MG tablet Take 81 mg by mouth daily.  . Atorvastatin Calcium (LIPITOR PO) Take 20 mg by mouth daily.   Marland Kitchen BIOTIN PO Take by mouth.  . zolpidem (AMBIEN) 10 MG tablet   . Probiotic Product ([place]) Take by mouth.  . [DISCONTINUED] B Complex Vitamins (VITAMIN B COMPLEX PO) Take by mouth daily.  . [DISCONTINUED] Ciclopirox (PENLAC EX) Apply topically.  . [DISCONTINUED] FLUZONE QUADRIVALENT injection   . [DISCONTINUED] GAVILYTE-N WITH FLAVOR PACK 420 G solution   . [DISCONTINUED] Zolpidem Tartrate (AMBIEN PO) Take by mouth as needed.    Allergy: No Known Allergies  Social Hx:   History   Social History  . Marital Status: Married    Spouse Name: N/A  Number of Children: N/A  . Years of Education: N/A   Occupational History  . Not on file.   Social History Main Topics  . Smoking status: Never Smoker   . Smokeless tobacco: Never Used  . Alcohol Use: No  . Drug Use: No  . Sexual Activity: Yes    Birth Control/ Protection: Surgical     Comment: TLH/BSO   Other Topics Concern  . Not on file    Social History Narrative    Past Surgical Hx:  Past Surgical History  Procedure Laterality Date  . Laparoscopic total hysterectomy  12/2009    with BSO, pelvic LND  . Cholecystectomy  1972  . Tubal ligation    . Dilation and curettage of uterus  10/2009    Past Medical Hx:  Past Medical History  Diagnosis Date  . Complex endometrial hyperplasia with atypia   . Endometrioid adenocarcinoma     stage IA, grade 1  . Hypercholesterolemia   . Asthma     mild    Oncology Hx:    Endometrial adenocarcinoma   12/16/2009 Surgery Robotic hysterctomy, BSO and pelvic LND. Ia grade 1    Family Hx:  Family History  Problem Relation Age of Onset  . Heart attack Mother   . Hypertension Father   . Hypertension Sister   . Coronary artery disease Sister   . Hypertension Brother   . Coronary artery disease Brother   . Diabetes Maternal Aunt   . Breast cancer Paternal Aunt   . Coronary artery disease Maternal Grandfather   . Diabetes Paternal Grandmother   . Diabetes Maternal Grandfather   . Diabetes Maternal Grandmother   . Diabetes Brother   . Depression Maternal Grandmother     Vitals:  Blood pressure 113/80, pulse 76, temperature 97.8 F (36.6 C), temperature source Oral, resp. rate 16, height 5' 3.75" (1.619 m), weight 152 lb 1.6 oz (68.992 kg), last menstrual period 01/11/1998.  Physical Exam: Well-nourished, well-developed female in no acute distress.   Neck: Supple, no lymphadenopathy, no thyromegaly.   Cardiovascular: Regular rate and rhythm.   Lungs: Regular rate and rhythm.  Abdomen: Soft, nontender, nondistended. There are no palpable masses or hepatosplenomegaly. There is no evidence of any incisional hernias.   Extremities: No edema.   Pelvic: Normal external female genitalia. The vagina is atrophic. The vaginal cuff is visualized are no visible lesions. Bimanual examination reveals no masses or nodularity. Rectal confirms.   Assessment/Plan:  63 year old  with stage IA grade 1 endometrioid adenocarcinoma diagnosed and treated 12/11 who we last saw 9/14. She has no evidence of disease on exam.She will see Dr. Sabra Heck in 6 months in followup with Dr. Inda Merlin has scheduled. She will return to see [place] for her last 5 year visit in one year's time. She knows that she can feel free to call us if she has any questions.     Ayrabella Labombard A., MD 11/29/2013, 9:57 AM

## 2013-11-29 NOTE — Addendum Note (Signed)
Addended by: Christa See on: 11/29/2013 10:14 AM   Modules accepted: Medications

## 2013-11-29 NOTE — Patient Instructions (Signed)
Please follow-up with Dr. Sabra Heck in 6 months. Return to see GYN oncology in one year. That will be your 5 year anniversary. Happy holidays!

## 2014-01-11 DIAGNOSIS — R319 Hematuria, unspecified: Secondary | ICD-10-CM

## 2014-01-11 HISTORY — DX: Hematuria, unspecified: R31.9

## 2014-03-26 ENCOUNTER — Other Ambulatory Visit (HOSPITAL_COMMUNITY): Payer: Self-pay | Admitting: Family Medicine

## 2014-03-26 ENCOUNTER — Other Ambulatory Visit: Payer: Self-pay

## 2014-03-26 DIAGNOSIS — Z1231 Encounter for screening mammogram for malignant neoplasm of breast: Secondary | ICD-10-CM

## 2014-04-11 ENCOUNTER — Ambulatory Visit
Admission: RE | Admit: 2014-04-11 | Discharge: 2014-04-11 | Disposition: A | Discharge status: Home / Self Care | Payer: Medicare PPO | Source: Ambulatory Visit

## 2014-04-11 DIAGNOSIS — Z1231 Encounter for screening mammogram for malignant neoplasm of breast: Secondary | ICD-10-CM

## 2014-04-25 ENCOUNTER — Ambulatory Visit (INDEPENDENT_AMBULATORY_CARE_PROVIDER_SITE_OTHER): Payer: Medicare PPO | Admitting: Obstetrics & Gynecology

## 2014-04-25 ENCOUNTER — Encounter: Payer: Self-pay | Admitting: Obstetrics & Gynecology

## 2014-04-25 VITALS — BP 124/74 | HR 60 | Resp 16 | Ht 64.0 in | Wt 157.8 lb

## 2014-04-25 DIAGNOSIS — N200 Calculus of kidney: Secondary | ICD-10-CM

## 2014-04-25 DIAGNOSIS — Z01419 Encounter for gynecological examination (general) (routine) without abnormal findings: Secondary | ICD-10-CM | POA: Diagnosis not present

## 2014-04-25 DIAGNOSIS — E2839 Other primary ovarian failure: Secondary | ICD-10-CM

## 2014-04-25 DIAGNOSIS — Z124 Encounter for screening for malignant neoplasm of cervix: Secondary | ICD-10-CM

## 2014-04-25 NOTE — Progress Notes (Signed)
64 y.o. G3P2 MarriedCaucasianF here for annual exam.  Doing well.  No vaginal bleeding.  Saw Dr. Alycia Rossetti 11/15.  Has one final visit this winter and she will be at her five year mark.    PCP:  Dr. Inda Merlin.  Last appointment was last month.  Pt recently had blood in her urine.  She had a small renal stone.   Patient's last menstrual period was 01/11/1998.          Sexually active: Yes.    The current method of family planning is status post hysterectomy.    Exercising: No.  not regularly Smoker:  no  Health Maintenance: Pap:  04/09/13 WNL-h/o endometrial cancer History of abnormal Pap:  yes MMG:  04/11/14 3D-normal Colonoscopy:  2016-repeat in 10 years, [facility] BMD:   2011 TDaP:  2013 with PCP Screening Labs: PCP, Hb today: PCP, Urine today: PCP and urologist   reports that she has never smoked. She has never used smokeless tobacco. She reports that she does not drink alcohol or use illicit drugs.  Past Medical History  Diagnosis Date  . Complex endometrial hyperplasia with atypia   . Endometrioid adenocarcinoma     stage IA, grade 1  . Hypercholesterolemia   . Asthma     mild  . Hematuria 2016    saw urologist-? passed small stone    Past Surgical History  Procedure Laterality Date  . Laparoscopic total hysterectomy  12/2009    with BSO, pelvic LND  . Cholecystectomy  1972  . Tubal ligation    . Dilation and curettage of uterus  10/2009    Current Outpatient Prescriptions  Medication Sig Dispense Refill  . Albuterol Sulfate (PROAIR HFA IN) Inhale into the lungs.    Marland Kitchen aspirin 81 MG tablet Take 81 mg by mouth daily.    . Atorvastatin Calcium (LIPITOR PO) Take 20 mg by mouth daily.     Marland Kitchen BIOTIN PO Take by mouth.    . Calcium Carb-Cholecalciferol (CALCIUM 600 + D) 600-200 MG-UNIT TABS Take 1 tablet by mouth daily.    . cholecalciferol (VITAMIN D) 1000 UNITS tablet Take 2,000 Units by mouth daily.    Marland Kitchen zolpidem (AMBIEN) 10 MG tablet     . Probiotic Product ([place]) Take by mouth.     No current facility-administered medications for this visit.    Family History  Problem Relation Age of Onset  . Heart attack Mother   . Hypertension Father   . Hypertension Sister   . Coronary artery disease Sister   . Hypertension Brother   . Coronary artery disease Brother   . Diabetes Maternal Aunt   . Breast cancer Paternal Aunt   . Coronary artery disease Maternal Grandfather   . Diabetes Paternal Grandmother   . Diabetes Maternal Grandfather   . Diabetes Maternal Grandmother   . Diabetes Brother   . Depression Maternal Grandmother     ROS:  Pertinent items are noted in HPI.  Otherwise, a comprehensive ROS was negative.  Exam:   General appearance: alert, cooperative and appears stated age Head: Normocephalic, without obvious abnormality, atraumatic Neck: no adenopathy, supple, symmetrical, trachea midline and thyroid normal to inspection and palpation Lungs: clear to auscultation bilaterally Breasts: normal appearance, no masses or tenderness Heart: regular rate and rhythm Abdomen: soft, non-tender; bowel sounds normal; no masses,  no organomegaly Extremities: extremities normal, atraumatic, no cyanosis or edema Skin: Skin color, texture, turgor normal. No rashes or lesions Lymph nodes:  Cervical, supraclavicular, and axillary nodes normal. No abnormal inguinal nodes palpated Neurologic: Grossly normal   Pelvic: External genitalia:  no lesions              Urethra:  normal appearing urethra with no masses, tenderness or lesions              Bartholins and Skenes: normal                 Vagina: normal appearing vagina with normal color and discharge, no lesions              Cervix: absent              Pap taken: Yes.   Bimanual Exam:  Uterus:  uterus absent              Adnexa: no mass, fullness, tenderness               Rectovaginal: Confirms               Anus:  normal sphincter tone, no lesions  Chaperone was present for  exam.  A:  Well Woman with normal exam H/O Grade I Adenocarcinoma of endometrium, s/p TLH/BSO/LND 12/11 with Dr. Alycia Rossetti Cystocele with mild SUI.  Has seen Ileana Roup in the past Recent hematuria with renal stone  P: Mammogram yearly pap smear today.  Pt will be seen yearly and she is aware of this. Seeing Dr. Inda Merlin in yearly.  Labs with her. return annually or prn

## 2014-04-27 LAB — IPS PAP SMEAR ONLY

## 2014-06-27 ENCOUNTER — Ambulatory Visit
Admission: RE | Admit: 2014-06-27 | Discharge: 2014-06-27 | Disposition: A | Discharge status: Home / Self Care | Payer: Medicare PPO | Source: Ambulatory Visit | Attending: Obstetrics & Gynecology | Admitting: Obstetrics & Gynecology

## 2014-06-27 DIAGNOSIS — E2839 Other primary ovarian failure: Secondary | ICD-10-CM

## 2014-06-27 DIAGNOSIS — N200 Calculus of kidney: Secondary | ICD-10-CM

## 2014-07-04 ENCOUNTER — Telehealth: Payer: Self-pay

## 2014-07-04 NOTE — Telephone Encounter (Signed)
Lmtcb//kn 

## 2014-07-04 NOTE — Telephone Encounter (Signed)
-----   Message from Megan Salon, MD sent at 07/03/2014  6:55 AM EDT ----- Please inform pt her BMD is very stable in her spine and there is a little change in her hip.  This is mild and I would recommend continuing to watch this.  Repeat BMD 3-4 years.  She is already on Calcium with Vit D.

## 2014-07-17 NOTE — Telephone Encounter (Signed)
Patient notified of results.//kn 

## 2014-10-16 ENCOUNTER — Ambulatory Visit: Payer: Medicare PPO | Attending: Gynecologic Oncology | Admitting: Gynecologic Oncology

## 2014-10-16 ENCOUNTER — Encounter: Payer: Self-pay | Admitting: Gynecologic Oncology

## 2014-10-16 VITALS — BP 122/73 | HR 64 | Temp 97.6°F | Resp 16 | Ht 64.0 in | Wt 150.4 lb

## 2014-10-16 DIAGNOSIS — C541 Malignant neoplasm of endometrium: Secondary | ICD-10-CM | POA: Diagnosis not present

## 2014-10-16 DIAGNOSIS — Z8542 Personal history of malignant neoplasm of other parts of uterus: Secondary | ICD-10-CM | POA: Diagnosis not present

## 2014-10-16 NOTE — Patient Instructions (Signed)
Happy 5 year anniversary! Congratulations! Please note that we will be happy to see you in the future should the need arise. Please follow-up with your other physicians as scheduled and see Dr. Sabra Heck in one year for routine gynecologic care.

## 2014-10-16 NOTE — Progress Notes (Signed)
Consult Note: Gyn-Onc  Haley Thompson 64 y.o. female  CC:  Chief Complaint  Patient presents with  . endometrial adenocarcinoma    follow-up    HPI: Haley Thompson is a very pleasant, 64 year old who had been on hormone replacement therapy and began having some spotting. Endometrial biopsy revealed simple and complex hyperplasia with atypia. She subsequently underwent a robotic hysterectomy, BSO and pelvic lymph nodes on December 16, 2009. Pathology revealed a superficially invasive endometrioid adenocarcinoma arising in the background of atypical complex hyperplasia confined to within the inner half of the myometrium. There was lymphovascular space involvement. The cervix, tubes and ovaries were negative. Zero out of 7 lymph nodes were involved.  In reviewing her pathologic factors, she was 64 years old at the time of diagnosis with a grade 1 lesion, approximately 12% myometrial invasion and 0 out of 7 lymph nodes with negative washings. While she does have lymphovascular space involvement, based on GOG-99, she only has one of the required two risk factors.  We last saw her 11/15.   Interval History:  She is doing very well. She saw Dr. Sabra Heck 6 months ago and had a negative examination and Pap smear. She is also up-to-date on her mammograms. She is followed regularly by Dr. Inda Merlin. She had a colonoscopy about on year ago was told she did not need another colonoscopy for 10 years. She's overall doing very well. She was seen by Dr. Inda Merlin in May for her annual and was also seen on time with Dr. Sabra Heck. She is up-to-date on her mammograms and her bone density studies. The left-sided pain she's having does appear to be dietary and related to her bowel habits. It is markedly improved. With some dietary changes she's lost weight and is having more regular bowel movements. She did have an episode of hematuria about 6 months ago. She was seen by urology and underwent a cystoscopy. They feel that she most likely  passed a small stone that was otherwise asymptomatic. She had no pain. No further workup is indicated at this time. She has another brother that was diagnosed with diabetes. There are no new cancers diagnosed in her family.  Review of Systems:  Constitutional: Denies fever. Skin: No rash Cardiovascular: No chest pain, shortness of breath, or edema  Pulmonary: No cough or wheeze.  Gastro Intestinal: No nausea, vomiting, occ constipation, no diarrhea reported. No bright red blood per rectum or change in bowel movement.  Genitourinary: No frequency, urgency, or dysuria.  Denies vaginal bleeding and discharge.  Musculoskeletal: No myalgia, arthralgia, joint swelling or pain.  Neurologic: No weakness Psychology: No changes  Current Meds:  Outpatient Encounter Prescriptions as of 10/16/2014  Medication Sig  . Albuterol Sulfate (PROAIR HFA IN) Inhale into the lungs as needed.   Marland Kitchen aspirin 81 MG tablet Take 81 mg by mouth daily.  Marland Kitchen atorvastatin (LIPITOR) 20 MG tablet Take 20 mg by mouth daily at 6 PM.   . BIOTIN PO Take 1 tablet by mouth daily.   . Calcium Carb-Cholecalciferol (CALCIUM 600 + D) 600-200 MG-UNIT TABS Take 1 tablet by mouth daily.  . cholecalciferol (VITAMIN D) 1000 UNITS tablet Take 2,000 Units by mouth daily.  . Probiotic Product ([place] [facility] PO) Take 1 tablet by mouth daily.   Marland Kitchen zolpidem (AMBIEN) 10 MG tablet Take 10 mg by mouth at bedtime as needed.   . [DISCONTINUED] Atorvastatin Calcium (LIPITOR PO) Take 20 mg by mouth daily.    No facility-administered encounter medications on file  as of 10/16/2014.    Allergy: No Known Allergies  Social Hx:   Social History   Social History  . Marital Status: Married    Spouse Name: N/A  . Number of Children: N/A  . Years of Education: N/A   Occupational History  . Not on file.   Social History Main Topics  . Smoking status: Never Smoker   . Smokeless tobacco: Never Used  . Alcohol Use: No  . Drug Use: No  .  Sexual Activity:    Partners: Male    Birth Control/ Protection: Surgical     Comment: TLH/BSO   Other Topics Concern  . Not on file   Social History Narrative    Past Surgical Hx:  Past Surgical History  Procedure Laterality Date  . Laparoscopic total hysterectomy  12/2009    with BSO, pelvic LND  . Cholecystectomy  1972  . Tubal ligation    . Dilation and curettage of uterus  10/2009    Past Medical Hx:  Past Medical History  Diagnosis Date  . Complex endometrial hyperplasia with atypia   . Endometrioid adenocarcinoma     stage IA, grade 1  . Hypercholesterolemia   . Asthma     mild  . Hematuria 2016    saw urologist-? passed small stone    Oncology Hx:    Endometrial adenocarcinoma ([place])   12/16/2009 Surgery Robotic hysterctomy, BSO and pelvic LND. Ia grade 1    Family Hx:  Family History  Problem Relation Age of Onset  . Heart attack Mother   . Hypertension Father   . Hypertension Sister   . Coronary artery disease Sister   . Hypertension Brother   . Coronary artery disease Brother   . Diabetes Maternal Aunt   . Breast cancer Paternal Aunt   . Coronary artery disease Maternal Grandfather   . Diabetes Paternal Grandmother   . Diabetes Maternal Grandfather   . Diabetes Maternal Grandmother   . Diabetes Brother   . Depression Maternal Grandmother     Vitals:  Blood pressure 122/73, pulse 64, temperature 97.6 F (36.4 C), temperature source Oral, resp. rate 16, height 5\' 4"  (1.626 m), weight 150 lb 6.4 oz (68.221 kg), last menstrual period 01/11/1998, SpO2 99 %.  Physical Exam: Well-nourished, well-developed female in no acute distress.   Neck: Supple, no lymphadenopathy, no thyromegaly.   Cardiovascular: Regular rate and rhythm.   Lungs: Regular rate and rhythm.  Abdomen: Soft, nontender, nondistended. There are no palpable masses or hepatosplenomegaly. There is no evidence of any incisional hernia.   Extremities: No edema.   Groins: No  lymphadenopathy  Pelvic: Normal external female genitalia. The vagina is atrophic. The vaginal cuff is visualized are no visible lesions. Bimanual examination reveals no masses or nodularity. Rectal confirms.   Assessment/Plan:  64 year old with stage IA grade 1 endometrioid adenocarcinoma diagnosed and treated 12/11 who we last saw 10/15. She has no evidence of disease on exam. She will be released from our clinic as it is been 5 years since her diagnosis. She'll follow up on a routine basis with Dr. Sabra Heck as well as her other physicians.    Memory Heinrichs A., MD 10/16/2014, 10:45 AM

## 2015-03-25 ENCOUNTER — Other Ambulatory Visit: Payer: Self-pay

## 2015-03-25 DIAGNOSIS — Z1231 Encounter for screening mammogram for malignant neoplasm of breast: Secondary | ICD-10-CM

## 2015-04-24 ENCOUNTER — Ambulatory Visit
Admission: RE | Admit: 2015-04-24 | Discharge: 2015-04-24 | Disposition: A | Discharge status: Home / Self Care | Payer: Medicare Other | Source: Ambulatory Visit

## 2015-04-24 DIAGNOSIS — Z1231 Encounter for screening mammogram for malignant neoplasm of breast: Secondary | ICD-10-CM

## 2015-07-10 ENCOUNTER — Encounter: Payer: Self-pay | Admitting: Obstetrics & Gynecology

## 2015-07-10 ENCOUNTER — Ambulatory Visit (INDEPENDENT_AMBULATORY_CARE_PROVIDER_SITE_OTHER): Payer: Medicare Other | Admitting: Obstetrics & Gynecology

## 2015-07-10 VITALS — BP 112/60 | HR 72 | Resp 16 | Ht 63.75 in | Wt 155.0 lb

## 2015-07-10 DIAGNOSIS — Z205 Contact with and (suspected) exposure to viral hepatitis: Secondary | ICD-10-CM

## 2015-07-10 DIAGNOSIS — C541 Malignant neoplasm of endometrium: Secondary | ICD-10-CM | POA: Diagnosis not present

## 2015-07-10 DIAGNOSIS — Z01419 Encounter for gynecological examination (general) (routine) without abnormal findings: Secondary | ICD-10-CM | POA: Diagnosis not present

## 2015-07-10 LAB — HEPATITIS C ANTIBODY: HCV Ab: NEGATIVE

## 2015-07-10 NOTE — Progress Notes (Signed)
65 y.o. G3P2 MarriedCaucasianF here for annual exam.  No vaginal bleeding.    Patient's last menstrual period was 01/11/1998.          Sexually active: Yes.    The current method of family planning is status post hysterectomy.    Exercising: Yes.    walking Smoker:  no  Health Maintenance: Pap:  04/25/14 neg History of abnormal Pap:  yes MMG:  04/24/15 BIRADS1 negative   Colonoscopy:  2016-repeat in 10 years, [facility] BMD:   06/27/14 BMD is very stable in her spine and there is a little change in her hip. This is mild and I would recommend continuing to watch this. Repeat BMD 3-4 years. She is already on Calcium with Vit D. TDaP:  2017 w/pcp  Pneumonia vaccine(s):  Prevnar 13 - 2017 Zostavax:   2016 as per patient Hep C testing: obtained today Screening Labs: PCP, Hb today: PCP, Urine today: PCP   reports that she has never smoked. She has never used smokeless tobacco. She reports that she does not drink alcohol or use illicit drugs.  Past Medical History  Diagnosis Date  . Complex endometrial hyperplasia with atypia   . Endometrioid adenocarcinoma     stage IA, grade 1  . Hypercholesterolemia   . Asthma     mild  . Hematuria 2016    saw urologist-? passed small stone    Past Surgical History  Procedure Laterality Date  . Laparoscopic total hysterectomy  12/2009    with BSO, pelvic LND  . Cholecystectomy  1972  . Tubal ligation    . Dilation and curettage of uterus  10/2009    Current Outpatient Prescriptions  Medication Sig Dispense Refill  . Albuterol Sulfate (PROAIR HFA IN) Inhale into the lungs as needed.     Marland Kitchen aspirin 81 MG tablet Take 81 mg by mouth daily.    Marland Kitchen atorvastatin (LIPITOR) 20 MG tablet Take 20 mg by mouth daily at 6 PM.     . BIOTIN PO Take 1 tablet by mouth daily.     . Calcium Carb-Cholecalciferol (CALCIUM 600 + D) 600-200 MG-UNIT TABS Take 1 tablet by mouth daily.    . cholecalciferol (VITAMIN D) 1000 UNITS tablet Take 2,000 Units by mouth daily.     . Probiotic Product (PHILLIPS COLON HEALTH PO) Take 1 tablet by mouth daily.     . traZODone (DESYREL) 50 MG tablet Take 50 mg by mouth at bedtime.    Marland Kitchen diptheria-tetanus toxoids [facility]) 2-2 LF/0.5ML injection Reported on 07/10/2015     No current facility-administered medications for this visit.    Family History  Problem Relation Age of Onset  . Heart attack Mother   . Hypertension Father   . Hypertension Sister   . Coronary artery disease Sister   . Hypertension Brother   . Coronary artery disease Brother   . Diabetes Maternal Aunt   . Breast cancer Paternal Aunt   . Coronary artery disease Maternal Grandfather   . Diabetes Paternal Grandmother   . Diabetes Maternal Grandfather   . Diabetes Maternal Grandmother   . Diabetes Brother   . Depression Maternal Grandmother     ROS:  Pertinent items are noted in HPI.  Otherwise, a comprehensive ROS was negative.  Exam:   BP 112/60 mmHg  Pulse 72  Resp 16  Ht 5' 3.75" (1.619 m)  Wt 155 lb (70.308 kg)  BMI 26.82 kg/m2  LMP 01/11/1998    Height: 5' 3.75" (161.9  cm)  Ht Readings from Last 3 Encounters:  07/10/15 5' 3.75" (1.619 m)  10/16/14 5\' 4"  (1.626 m)  04/25/14 5\' 4"  (1.626 m)    General appearance: alert, cooperative and appears stated age Head: Normocephalic, without obvious abnormality, atraumatic Neck: no adenopathy, supple, symmetrical, trachea midline and thyroid normal to inspection and palpation Lungs: clear to auscultation bilaterally Breasts: normal appearance, no masses or tenderness Heart: regular rate and rhythm Abdomen: soft, non-tender; bowel sounds normal; no masses,  no organomegaly Extremities: extremities normal, atraumatic, no cyanosis or edema Skin: Skin color, texture, turgor normal. No rashes or lesions Lymph nodes: Cervical, supraclavicular, and axillary nodes normal. No abnormal inguinal nodes palpated Neurologic: Grossly normal   Pelvic: External genitalia:  no lesions               Urethra:  normal appearing urethra with no masses, tenderness or lesions              Bartholins and Skenes: normal                 Vagina: normal appearing vagina with normal color and discharge, no lesions, second degree cystocele noted today              Cervix: absent              Pap taken: No. Bimanual Exam:  Uterus:  uterus absent              Adnexa: normal adnexa and no mass, fullness, tenderness               Rectovaginal: Confirms               Anus:  normal sphincter tone, no lesions  Chaperone was present for exam.  A:  Well Woman with normal exam H/O Grade I Adenocarcinoma of endometrium, s/p TLH/BSO/LND 12/11 with Dr. Alycia Rossetti.  Was released last year. Cystocele with mild SUI. Has seen Ileana Roup in the past and doing well.  Does not does desire additional treatment at this time. H/O nephrolithiasis  P: Mammogram yearly D/w pt pap guidelines for after hysterectomy with endometrial adenocarcinoma.  Pt is comfortable with not having Pap smears. Just saw Dr. Inda Merlin earlier this month Hep C antibody obtained today return annually or prn

## 2016-03-31 ENCOUNTER — Other Ambulatory Visit: Payer: Self-pay | Admitting: Family Medicine

## 2016-03-31 DIAGNOSIS — Z1231 Encounter for screening mammogram for malignant neoplasm of breast: Secondary | ICD-10-CM

## 2016-05-13 ENCOUNTER — Ambulatory Visit
Admission: RE | Admit: 2016-05-13 | Discharge: 2016-05-13 | Disposition: A | Discharge status: Home / Self Care | Payer: Medicare Other | Source: Ambulatory Visit | Attending: Family Medicine | Admitting: Family Medicine

## 2016-05-13 DIAGNOSIS — Z1231 Encounter for screening mammogram for malignant neoplasm of breast: Secondary | ICD-10-CM

## 2016-10-13 NOTE — Progress Notes (Signed)
66 y.o. G3P2 Married Caucasian F here for annual exam.  Doing well.  Son and his family moved to [place], [place], so this makes them a little sad to have this family so far away.  Denies vaginal bleeding.     PCP:  Dr. Inda Merlin.  Was normal per pt in June.  Patient's last menstrual period was 12/11/2009.          Sexually active: Yes.    The current method of family planning is status post hysterectomy.   Exercising: Yes.    Gym/ health club routine includes cardio. Smoker:  no  Health Maintenance: Pap:  04/25/14 negative, 04/09/13 negative, 07/28/11 negative  History of abnormal Pap:  yes MMG:  05/13/16 BIRADS 1 negative Colonoscopy:  2016- repeat 10 years, [facility]  BMD:   06/27/14 osteopenia- repeat 3-4 years  TDaP:  2017 with PCP Pneumonia vaccine(s):  Done with PCP  Zostavax:   Shingrix completed 2018  Hep C testing: 07/10/15 negative  Screening Labs: PCP, Hb today: PCP   reports that she has never smoked. She has never used smokeless tobacco. She reports that she does not drink alcohol or use drugs.  Past Medical History:  Diagnosis Date  . Asthma    mild  . Complex endometrial hyperplasia with atypia   . Endometrioid adenocarcinoma    stage IA, grade 1  . Hematuria 2016   saw urologist-? passed small stone  . Hypercholesterolemia     Past Surgical History:  Procedure Laterality Date  . CHOLECYSTECTOMY  1972  . DILATION AND CURETTAGE OF UTERUS  10/2009  . LAPAROSCOPIC TOTAL HYSTERECTOMY  12/2009   with BSO, pelvic LND  . TUBAL LIGATION      Current Outpatient Prescriptions  Medication Sig Dispense Refill  . aspirin 81 MG tablet Take 81 mg by mouth daily.    Marland Kitchen atorvastatin (LIPITOR) 20 MG tablet Take 20 mg by mouth daily at 6 PM.     . BIOTIN PO Take 1 tablet by mouth daily.     . Calcium Carb-Cholecalciferol (CALCIUM 600 + D) 600-200 MG-UNIT TABS Take 1 tablet by mouth daily.    . cholecalciferol (VITAMIN D) 1000 UNITS tablet Take 2,000 Units by mouth daily.    Marland Kitchen  diptheria-tetanus toxoids [facility]) 2-2 LF/0.5ML injection Reported on 07/10/2015    . MELATONIN PO Take by mouth.    . Probiotic Product (PHILLIPS COLON HEALTH PO) Take 1 tablet by mouth daily.     . traZODone (DESYREL) 50 MG tablet Take 50 mg by mouth at bedtime.     No current facility-administered medications for this visit.     Family History  Problem Relation Age of Onset  . Heart attack Mother   . Hypertension Father   . Hypertension Sister   . Coronary artery disease Sister   . Hypertension Brother   . Coronary artery disease Brother   . Diabetes Brother   . Diabetes Maternal Aunt   . Breast cancer Paternal Aunt   . Coronary artery disease Maternal Grandfather   . Diabetes Maternal Grandfather   . Diabetes Paternal Grandmother   . Diabetes Maternal Grandmother   . Depression Maternal Grandmother   . Diabetes Brother     ROS:  Pertinent items are noted in HPI.  Otherwise, a comprehensive ROS was negative.  Exam:   BP 110/66 (BP Location: Right Arm, Patient Position: Sitting, Cuff Size: Normal)   Pulse 70   Resp 14   Ht 5' 3.75" (1.619  m)   Wt 159 lb (72.1 kg)   LMP 12/11/2009   BMI 27.51 kg/m   Weight change: @WEIGHTCHANGE @ Height:   Height: 5' 3.75" (161.9 cm)  Ht Readings from Last 3 Encounters:  10/14/16 5' 3.75" (1.619 m)  07/10/15 5' 3.75" (1.619 m)  10/16/14 5\' 4"  (1.626 m)    General appearance: alert, cooperative and appears stated age Head: Normocephalic, without obvious abnormality, atraumatic Neck: no adenopathy, supple, symmetrical, trachea midline and thyroid normal to inspection and palpation Lungs: clear to auscultation bilaterally Breasts: normal appearance, no masses or tenderness Heart: regular rate and rhythm Abdomen: soft, non-tender; bowel sounds normal; no masses,  no organomegaly Extremities: extremities normal, atraumatic, no cyanosis or edema Skin: Skin color, texture, turgor normal. No rashes or lesions Lymph nodes: Cervical,  supraclavicular, and axillary nodes normal. No abnormal inguinal nodes palpated Neurologic: Grossly normal   Pelvic: External genitalia:  no lesions              Urethra:  normal appearing urethra with no masses, tenderness or lesions              Bartholins and Skenes: normal                 Vagina: normal appearing vagina with normal color and discharge, no lesions              Cervix: absent              Pap taken: No. Bimanual Exam:  Uterus:  uterus absent              Adnexa: no mass, fullness, tenderness               Rectovaginal: Confirms               Anus:  normal sphincter tone, no lesions  Chaperone was present for exam.  A:  Well Woman with normal exam H/O Grade 1 adenocarcinoma of endometrium, s/p TLH/BSO/LND 12/11 with Dr. Alycia Rossetti.   Cystocele with mild SUI.  Has seen Ileana Roup in the past.  H/O nephrolithiasis H/O elevated lipids  P:   Mammogram yearly.  Guidelines reviewed. pap smear not indicated. Lab work with Dr. Inda Merlin in June Return annually or prn

## 2016-10-14 ENCOUNTER — Ambulatory Visit (INDEPENDENT_AMBULATORY_CARE_PROVIDER_SITE_OTHER): Payer: Medicare Other | Admitting: Obstetrics & Gynecology

## 2016-10-14 ENCOUNTER — Encounter: Payer: Self-pay | Admitting: Obstetrics & Gynecology

## 2016-10-14 VITALS — BP 110/66 | HR 70 | Resp 14 | Ht 63.75 in | Wt 159.0 lb

## 2016-10-14 DIAGNOSIS — Z01419 Encounter for gynecological examination (general) (routine) without abnormal findings: Secondary | ICD-10-CM

## 2017-04-27 ENCOUNTER — Other Ambulatory Visit: Payer: Self-pay | Admitting: Family Medicine

## 2017-04-27 DIAGNOSIS — Z1231 Encounter for screening mammogram for malignant neoplasm of breast: Secondary | ICD-10-CM

## 2017-05-19 ENCOUNTER — Ambulatory Visit: Payer: Medicare Other

## 2017-05-19 ENCOUNTER — Ambulatory Visit
Admission: RE | Admit: 2017-05-19 | Discharge: 2017-05-19 | Disposition: A | Discharge status: Home / Self Care | Payer: Medicare Other | Source: Ambulatory Visit | Attending: Family Medicine | Admitting: Family Medicine

## 2017-05-19 DIAGNOSIS — Z1231 Encounter for screening mammogram for malignant neoplasm of breast: Secondary | ICD-10-CM

## 2017-07-07 ENCOUNTER — Other Ambulatory Visit: Payer: Self-pay | Admitting: Family Medicine

## 2017-07-07 DIAGNOSIS — M8580 Other specified disorders of bone density and structure, unspecified site: Secondary | ICD-10-CM

## 2017-08-25 ENCOUNTER — Ambulatory Visit
Admission: RE | Admit: 2017-08-25 | Discharge: 2017-08-25 | Disposition: A | Discharge status: Home / Self Care | Payer: Medicare Other | Source: Ambulatory Visit | Attending: Family Medicine | Admitting: Family Medicine

## 2017-08-25 DIAGNOSIS — M8580 Other specified disorders of bone density and structure, unspecified site: Secondary | ICD-10-CM

## 2017-11-30 ENCOUNTER — Encounter: Payer: Self-pay | Admitting: Obstetrics & Gynecology

## 2017-12-05 ENCOUNTER — Ambulatory Visit: Payer: Medicare Other | Admitting: Obstetrics & Gynecology

## 2017-12-22 ENCOUNTER — Ambulatory Visit: Payer: Medicare Other | Admitting: Obstetrics & Gynecology

## 2017-12-22 ENCOUNTER — Encounter

## 2018-02-03 ENCOUNTER — Encounter: Payer: Self-pay | Admitting: Obstetrics & Gynecology

## 2018-02-03 ENCOUNTER — Other Ambulatory Visit: Payer: Self-pay

## 2018-02-03 ENCOUNTER — Ambulatory Visit (INDEPENDENT_AMBULATORY_CARE_PROVIDER_SITE_OTHER): Payer: Medicare Other | Admitting: Obstetrics & Gynecology

## 2018-02-03 VITALS — BP 110/80 | HR 80 | Resp 16 | Ht 63.75 in | Wt 160.0 lb

## 2018-02-03 DIAGNOSIS — Z01419 Encounter for gynecological examination (general) (routine) without abnormal findings: Secondary | ICD-10-CM

## 2018-02-03 NOTE — Progress Notes (Signed)
68 y.o. G3P2 Married White or Caucasian female here for annual exam.  She is doing well but husband is having a lot of back pain.  Surgery is planned with Dr. Ellene Route.  Holiday was nice but in [place] because her son got [place].    Denies vaginal bleeding.    Moved to [place] this past year.  They are at [place] on the [place].  She does not have to do yard work anymore.  They are in a duplex and have a lovely neighbor.    Patient's last menstrual period was 12/11/2009.          Sexually active: Yes.    The current method of family planning is status post hysterectomy.    Exercising: Yes.    gym, walking Smoker:  no  Health Maintenance: Pap:  04/25/14 Neg   04/09/13 Neg History of abnormal Pap:  yes MMG:  05/19/17 BIRADS1:Neg  Colonoscopy:  2016 f/u 10 years. [facility] BMD:   08/25/17 osteopenia (?)  Pt is unsure of the date.   TDaP:  2017 Pneumonia vaccine(s):  done Shingrix: Completed  Hep C testing: 07/10/15 Neg  Screening Labs: PCP   reports that she has never smoked. She has never used smokeless tobacco. She reports that she does not drink alcohol or use drugs.  Past Medical History:  Diagnosis Date  . Asthma    mild  . Complex endometrial hyperplasia with atypia   . Endometrioid adenocarcinoma    stage IA, grade 1  . Hematuria 2016   saw urologist-? passed small stone  . Hypercholesterolemia     Past Surgical History:  Procedure Laterality Date  . CHOLECYSTECTOMY  1972  . DILATION AND CURETTAGE OF UTERUS  10/2009  . LAPAROSCOPIC TOTAL HYSTERECTOMY  12/2009   with BSO, pelvic LND  . TUBAL LIGATION      Current Outpatient Medications  Medication Sig Dispense Refill  . atorvastatin (LIPITOR) 20 MG tablet Take 20 mg by mouth daily at 6 PM.     . BIOTIN PO Take 1 tablet by mouth daily.     . Calcium Carb-Cholecalciferol (CALCIUM 600 + D) 600-200 MG-UNIT TABS Take 1 tablet by mouth daily.    . cholecalciferol (VITAMIN D) 1000 UNITS tablet  Take 3,000 Units by mouth daily.     . Doxepin HCl 6 MG TABS Take 1 tablet by mouth at bedtime as needed.    . Probiotic Product (PHILLIPS COLON HEALTH PO) Take 1 tablet by mouth daily.      No current facility-administered medications for this visit.     Family History  Problem Relation Age of Onset  . Heart attack Mother   . Hypertension Father   . Hypertension Sister   . Coronary artery disease Sister   . Hypertension Brother   . Coronary artery disease Brother   . Diabetes Brother   . Diabetes Maternal Aunt   . Breast cancer Paternal Aunt   . Coronary artery disease Maternal Grandfather   . Diabetes Maternal Grandfather   . Diabetes Paternal Grandmother   . Diabetes Maternal Grandmother   . Depression Maternal Grandmother   . Diabetes Brother     Review of Systems  Genitourinary: Positive for frequency and urgency.  All other systems reviewed and are negative.   Exam:   BP 110/80 (BP Location: Left Arm, Patient Position: Sitting, Cuff Size: Large)   Pulse 80   Resp 16   Ht 5' 3.75" (  1.619 m)   Wt 160 lb (72.6 kg)   LMP 12/11/2009   BMI 27.68 kg/m     Height: 5' 3.75" (161.9 cm)  Ht Readings from Last 3 Encounters:  02/03/18 5' 3.75" (1.619 m)  10/14/16 5' 3.75" (1.619 m)  07/10/15 5' 3.75" (1.619 m)    General appearance: alert, cooperative and appears stated age Head: Normocephalic, without obvious abnormality, atraumatic Neck: no adenopathy, supple, symmetrical, trachea midline and thyroid normal to inspection and palpation Lungs: clear to auscultation bilaterally Breasts: normal appearance, no masses or tenderness Heart: regular rate and rhythm Abdomen: soft, non-tender; bowel sounds normal; no masses,  no organomegaly Extremities: extremities normal, atraumatic, no cyanosis or edema Skin: Skin color, texture, turgor normal. No rashes or lesions Lymph nodes: Cervical, supraclavicular, and axillary nodes normal. No abnormal inguinal nodes  palpated Neurologic: Grossly normal   Pelvic: External genitalia:  no lesions              Urethra:  normal appearing urethra with no masses, tenderness or lesions              Bartholins and Skenes: normal                 Vagina: normal appearing vagina with normal color and discharge, no lesions              Cervix: absent              Pap taken: No. Bimanual Exam:  Uterus:  uterus absent              Adnexa: no mass, fullness, tenderness               Rectovaginal: Confirms               Anus:  normal sphincter tone, no lesions  Chaperone was present for exam.  A:  Well Woman with normal exam H/o Grade 1 adenocarcinoma of endometrium, s/p TLH?BSO/LND 12/11 with Dr. Margot Ables with mild SUI H/O nephrolithiasis  Hypercholesterolemia   P:   Mammogram guidelines reviewed  pap smear not indicated Will have pt sign release of records for BMD.  She thinks she may have had one done in 2018 but last records I have was for 2016.  If was done in 2016, will place order and she will have done with her MMG Lab work will be due this summer Return annually or prn

## 2018-04-27 ENCOUNTER — Other Ambulatory Visit: Payer: Self-pay | Admitting: Family Medicine

## 2018-04-27 ENCOUNTER — Other Ambulatory Visit: Payer: Self-pay | Admitting: Obstetrics & Gynecology

## 2018-04-27 ENCOUNTER — Other Ambulatory Visit: Payer: Self-pay

## 2018-04-27 DIAGNOSIS — Z1231 Encounter for screening mammogram for malignant neoplasm of breast: Secondary | ICD-10-CM

## 2018-07-06 ENCOUNTER — Other Ambulatory Visit: Payer: Self-pay

## 2018-07-06 ENCOUNTER — Ambulatory Visit
Admission: RE | Admit: 2018-07-06 | Discharge: 2018-07-06 | Disposition: A | Discharge status: Home / Self Care | Payer: Medicare Other | Source: Ambulatory Visit | Attending: Family Medicine | Admitting: Family Medicine

## 2018-07-06 DIAGNOSIS — Z1231 Encounter for screening mammogram for malignant neoplasm of breast: Secondary | ICD-10-CM

## 2019-02-01 ENCOUNTER — Other Ambulatory Visit: Payer: Self-pay

## 2019-02-05 ENCOUNTER — Ambulatory Visit (INDEPENDENT_AMBULATORY_CARE_PROVIDER_SITE_OTHER): Payer: Medicare PPO | Admitting: Obstetrics & Gynecology

## 2019-02-05 ENCOUNTER — Other Ambulatory Visit: Payer: Self-pay

## 2019-02-05 ENCOUNTER — Encounter: Payer: Self-pay | Admitting: Obstetrics & Gynecology

## 2019-02-05 VITALS — BP 110/60 | HR 84 | Temp 97.2°F | Resp 10 | Ht 63.75 in | Wt 162.0 lb

## 2019-02-05 DIAGNOSIS — Z01419 Encounter for gynecological examination (general) (routine) without abnormal findings: Secondary | ICD-10-CM

## 2019-02-05 DIAGNOSIS — Z8542 Personal history of malignant neoplasm of other parts of uterus: Secondary | ICD-10-CM | POA: Diagnosis not present

## 2019-02-05 NOTE — Progress Notes (Signed)
69 y.o. G3P2 Married White or Caucasian female here for annual exam.  Doing well.  Hasn't been to [place] to see family living there.  Has seen family in [place].    They moved to [facility] in September in the [place].  Her brother moved to [facility].    Denies vaginal bleeding.  She still has a little incontinence.  Doesn't feel any worse to her.  Did PT several years ago.  She would consider PT again if/when things improve with Covid.    Patient's last menstrual period was 12/11/2009.          Sexually active: Yes.    The current method of family planning is status post hysterectomy.    Exercising: Yes.    walking Smoker:  no  Health Maintenance: Pap:  04/25/14 Neg             04/09/13 Neg History of abnormal Pap:  yes MMG:  07/06/18 BIRADS 1 negative/denstiy B Colonoscopy:  2016 f/u 10 years. [facility]. BMD:   08/25/17 Osteopenia TDaP:  2017 Pneumonia vaccine(s):  completed Shingrix:   completed Hep C testing: 07/10/15 Neg Screening Labs: PCP   reports that she has never smoked. She has never used smokeless tobacco. She reports that she does not drink alcohol or use drugs.  Past Medical History:  Diagnosis Date  . Asthma    mild  . Complex endometrial hyperplasia with atypia   . Endometrioid adenocarcinoma    stage IA, grade 1  . Hematuria 2016   saw urologist-? passed small stone  . Hypercholesterolemia     Past Surgical History:  Procedure Laterality Date  . CHOLECYSTECTOMY  1972  . DILATION AND CURETTAGE OF UTERUS  10/2009  . LAPAROSCOPIC TOTAL HYSTERECTOMY  12/2009   with BSO, pelvic LND  . TUBAL LIGATION      Current Outpatient Medications  Medication Sig Dispense Refill  . atorvastatin (LIPITOR) 20 MG tablet Take 20 mg by mouth daily at 6 PM.     . BIOTIN PO Take 1 tablet by mouth daily.     . Calcium Carb-Cholecalciferol (CALCIUM 600 + D) 600-200 MG-UNIT TABS Take 1 tablet by mouth daily.    . cholecalciferol (VITAMIN D) 1000 UNITS tablet Take  3,000 Units by mouth daily.     . Doxylamine Succinate, Sleep, (SLEEP AID PO) Take by mouth.    . Probiotic Product (PHILLIPS COLON HEALTH PO) Take 1 tablet by mouth daily.      No current facility-administered medications for this visit.    Family History  Problem Relation Age of Onset  . Heart attack Mother   . Hypertension Father   . Hypertension Sister   . Coronary artery disease Sister   . Hypertension Brother   . Coronary artery disease Brother   . Diabetes Brother   . Diabetes Maternal Aunt   . Breast cancer Paternal Aunt   . Coronary artery disease Maternal Grandfather   . Diabetes Maternal Grandfather   . Diabetes Paternal Grandmother   . Diabetes Maternal Grandmother   . Depression Maternal Grandmother   . Diabetes Brother     Review of Systems  All other systems reviewed and are negative.   Exam:   BP 110/60 (BP Location: Left Arm, Patient Position: Sitting, Cuff Size: Normal)   Pulse 84   Temp (!) 97.2 F (36.2 C) (Temporal)   Resp 10   Ht 5' 3.75" (1.619 m)   Wt 162 lb (73.5 kg)  LMP 12/11/2009   BMI 28.03 kg/m    Height: 5' 3.75" (161.9 cm)  Ht Readings from Last 3 Encounters:  02/05/19 5' 3.75" (1.619 m)  02/03/18 5' 3.75" (1.619 m)  10/14/16 5' 3.75" (1.619 m)    General appearance: alert, cooperative and appears stated age Head: Normocephalic, without obvious abnormality, atraumatic Neck: no adenopathy, supple, symmetrical, trachea midline and thyroid normal to inspection and palpation Lungs: clear to auscultation bilaterally Breasts: normal appearance, no masses or tenderness Heart: regular rate and rhythm Abdomen: soft, non-tender; bowel sounds normal; no masses,  no organomegaly Extremities: extremities normal, atraumatic, no cyanosis or edema Skin: Skin color, texture, turgor normal. No rashes or lesions Lymph nodes: Cervical, supraclavicular, and axillary nodes normal. No abnormal inguinal nodes palpated Neurologic: Grossly  normal   Pelvic: External genitalia:  no lesions              Urethra:  normal appearing urethra with no masses, tenderness or lesions              Bartholins and Skenes: normal                 Vagina: normal appearing vagina with normal color and discharge, no lesions              Cervix: absent              Pap taken: No. Bimanual Exam:  Uterus:  uterus absent              Adnexa: no mass, fullness, tenderness               Rectovaginal: Confirms               Anus:  normal sphincter tone, no lesions  Chaperone, Terence Lux, CMA, was present for exam.  A:  Well Woman with normal exam H/o Grade 1 adenocarcinoma of the endometrium, s/p TLH/BSO/LND 12/11 with Dr. Margot Ables with mild SUI H/o nephrolithiasis Hypercholesterolemia   P:   Mammogram guidelines reviewed. pap smear not indicated Will see Dr. Lindell Noe for the first time later this year Return annually or prn

## 2019-02-19 ENCOUNTER — Ambulatory Visit: Payer: Medicare Other

## 2019-02-19 ENCOUNTER — Ambulatory Visit: Payer: Medicare PPO | Attending: Internal Medicine

## 2019-02-19 DIAGNOSIS — Z23 Encounter for immunization: Secondary | ICD-10-CM | POA: Insufficient documentation

## 2019-02-19 NOTE — Progress Notes (Signed)
   Covid-19 Vaccination Clinic  Name:  Haley Thompson    MRN: [id number] DOB: August 14, 1950  02/19/2019  [name] was observed post Covid-19 immunization for 15 minutes without incidence. She was provided with Vaccine Information Sheet and instruction to access the V-Safe system.   [name] was instructed to call 911 with any severe reactions post vaccine: Marland Kitchen Difficulty breathing  . Swelling of your face and throat  . A fast heartbeat  . A bad rash all over your body  . Dizziness and weakness    Immunizations Administered    Name Date Dose VIS Date Route   Pfizer COVID-19 Vaccine 02/19/2019  8:35 AM 0.3 mL 12/22/2018 Intramuscular   Manufacturer: [place]   Lot: [id number]   [place]: [id number]

## 2019-03-09 ENCOUNTER — Ambulatory Visit: Payer: Medicare Other

## 2019-03-14 ENCOUNTER — Ambulatory Visit: Payer: Medicare PPO | Attending: Internal Medicine

## 2019-03-14 DIAGNOSIS — Z23 Encounter for immunization: Secondary | ICD-10-CM | POA: Insufficient documentation

## 2019-03-14 NOTE — Progress Notes (Signed)
   Covid-19 Vaccination Clinic  Name:  Haley Thompson    MRN: [id number] DOB: [birth year]  03/14/2019  Haley Thompson was observed post Covid-19 immunization for 15 minutes without incident. She was provided with Vaccine Information Sheet and instruction to access the V-Safe system.   Haley Thompson was instructed to call 911 with any severe reactions post vaccine: Marland Kitchen Difficulty breathing  . Swelling of face and throat  . A fast heartbeat  . A bad rash all over body  . Dizziness and weakness   Immunizations Administered    Name Date Dose VIS Date Route   Pfizer COVID-19 Vaccine 03/14/2019  9:27 AM 0.3 mL 12/22/2018 Intramuscular   Manufacturer: [place]   Lot: [id number]   [place]: [id number]

## 2019-06-05 ENCOUNTER — Other Ambulatory Visit: Payer: Self-pay | Admitting: Family Medicine

## 2019-06-05 DIAGNOSIS — Z1231 Encounter for screening mammogram for malignant neoplasm of breast: Secondary | ICD-10-CM

## 2019-07-12 ENCOUNTER — Ambulatory Visit
Admission: RE | Admit: 2019-07-12 | Discharge: 2019-07-12 | Disposition: A | Discharge status: Home / Self Care | Payer: Medicare PPO | Source: Ambulatory Visit | Attending: Family Medicine | Admitting: Family Medicine

## 2019-07-12 ENCOUNTER — Other Ambulatory Visit: Payer: Self-pay

## 2019-07-12 DIAGNOSIS — Z1231 Encounter for screening mammogram for malignant neoplasm of breast: Secondary | ICD-10-CM

## 2019-07-30 ENCOUNTER — Other Ambulatory Visit: Payer: Self-pay | Admitting: Family Medicine

## 2019-07-30 DIAGNOSIS — M8580 Other specified disorders of bone density and structure, unspecified site: Secondary | ICD-10-CM

## 2019-10-25 ENCOUNTER — Other Ambulatory Visit: Payer: Self-pay

## 2019-10-25 ENCOUNTER — Ambulatory Visit
Admission: RE | Admit: 2019-10-25 | Discharge: 2019-10-25 | Disposition: A | Discharge status: Home / Self Care | Payer: Medicare PPO | Source: Ambulatory Visit | Attending: Family Medicine | Admitting: Family Medicine

## 2019-10-25 DIAGNOSIS — Z780 Asymptomatic menopausal state: Secondary | ICD-10-CM | POA: Diagnosis not present

## 2019-10-25 DIAGNOSIS — M8589 Other specified disorders of bone density and structure, multiple sites: Secondary | ICD-10-CM | POA: Diagnosis not present

## 2019-10-25 DIAGNOSIS — M8580 Other specified disorders of bone density and structure, unspecified site: Secondary | ICD-10-CM

## 2019-11-15 DIAGNOSIS — L821 Other seborrheic keratosis: Secondary | ICD-10-CM | POA: Diagnosis not present

## 2019-11-15 DIAGNOSIS — L570 Actinic keratosis: Secondary | ICD-10-CM | POA: Diagnosis not present

## 2019-11-15 DIAGNOSIS — L918 Other hypertrophic disorders of the skin: Secondary | ICD-10-CM | POA: Diagnosis not present

## 2019-11-15 DIAGNOSIS — D1801 Hemangioma of skin and subcutaneous tissue: Secondary | ICD-10-CM | POA: Diagnosis not present

## 2019-11-15 DIAGNOSIS — I788 Other diseases of capillaries: Secondary | ICD-10-CM | POA: Diagnosis not present

## 2019-11-15 DIAGNOSIS — L814 Other melanin hyperpigmentation: Secondary | ICD-10-CM | POA: Diagnosis not present

## 2019-11-15 DIAGNOSIS — L720 Epidermal cyst: Secondary | ICD-10-CM | POA: Diagnosis not present

## 2020-03-05 DIAGNOSIS — J45901 Unspecified asthma with (acute) exacerbation: Secondary | ICD-10-CM | POA: Diagnosis not present

## 2020-04-10 ENCOUNTER — Ambulatory Visit (INDEPENDENT_AMBULATORY_CARE_PROVIDER_SITE_OTHER): Payer: Medicare HMO | Admitting: Obstetrics & Gynecology

## 2020-04-10 ENCOUNTER — Encounter (HOSPITAL_BASED_OUTPATIENT_CLINIC_OR_DEPARTMENT_OTHER): Payer: Self-pay | Admitting: Obstetrics & Gynecology

## 2020-04-10 ENCOUNTER — Other Ambulatory Visit: Payer: Self-pay

## 2020-04-10 VITALS — BP 120/78 | HR 81 | Ht 64.0 in | Wt 162.0 lb

## 2020-04-10 DIAGNOSIS — Z8249 Family history of ischemic heart disease and other diseases of the circulatory system: Secondary | ICD-10-CM | POA: Diagnosis not present

## 2020-04-10 DIAGNOSIS — N3941 Urge incontinence: Secondary | ICD-10-CM | POA: Diagnosis not present

## 2020-04-10 DIAGNOSIS — N8110 Cystocele, unspecified: Secondary | ICD-10-CM | POA: Insufficient documentation

## 2020-04-10 DIAGNOSIS — Z9189 Other specified personal risk factors, not elsewhere classified: Secondary | ICD-10-CM

## 2020-04-10 DIAGNOSIS — M8580 Other specified disorders of bone density and structure, unspecified site: Secondary | ICD-10-CM

## 2020-04-10 DIAGNOSIS — Z90710 Acquired absence of both cervix and uterus: Secondary | ICD-10-CM

## 2020-04-10 DIAGNOSIS — Z01419 Encounter for gynecological examination (general) (routine) without abnormal findings: Secondary | ICD-10-CM

## 2020-04-10 DIAGNOSIS — C541 Malignant neoplasm of endometrium: Secondary | ICD-10-CM

## 2020-04-10 DIAGNOSIS — Z8542 Personal history of malignant neoplasm of other parts of uterus: Secondary | ICD-10-CM | POA: Diagnosis not present

## 2020-04-10 MED ORDER — MIRABEGRON ER 25 MG PO TB24: 25.0000 mg | ORAL_TABLET | Freq: Every day | ORAL | 2 refills | Status: DC

## 2020-04-10 NOTE — Progress Notes (Signed)
70 y.o. G3P2 Married White or Caucasian female here for breast and pelvic exam.  Pt has hx of endometrial cancer.    Denies vaginal bleeding.  She continues to have issues with urinary urgency and then she can leak.  Did PT.  Has tried OTC Azo.    Has strong family history of CVD.  Brother, 13, died last year.  Has never seen cardiologist.  Discussed this in the past with prior PCP.  They discussed stress test.  Feel coronary CT may be beneficial with her.  Patient's last menstrual period was 12/11/2009.          Sexually active: Yes.    H/O STD:  no  Health Maintenance: PCP:  Dr. Lindell Noe.  Last wellness appt was a virtual visit.  Did blood work at that appt:  07/2019 Vaccines are up to date:  We think but release signed today Colonoscopy:  2016 follow up 10 year MMG:  07/12/2019 BMD:  8/19, -2.0 Last pap smear:  2016, h/o hysterectomy.   H/o abnormal pap smear:  no   reports that she has never smoked. She has never used smokeless tobacco. She reports that she does not drink alcohol and does not use drugs.  Past Medical History:  Diagnosis Date  . Asthma    mild  . Complex endometrial hyperplasia with atypia   . Endometrioid adenocarcinoma    stage IA, grade 1  . Hematuria 2016   saw urologist-? passed small stone  . Hypercholesterolemia     Past Surgical History:  Procedure Laterality Date  . CHOLECYSTECTOMY  1972  . DILATION AND CURETTAGE OF UTERUS  10/2009  . LAPAROSCOPIC TOTAL HYSTERECTOMY  12/2009   with BSO, pelvic LND  . TUBAL LIGATION      Current Outpatient Medications  Medication Sig Dispense Refill  . atorvastatin (LIPITOR) 20 MG tablet Take 20 mg by mouth daily at 6 PM.     . BIOTIN PO Take 1 tablet by mouth daily.     . Calcium Carb-Cholecalciferol 600-200 MG-UNIT TABS Take 1 tablet by mouth daily.    . cholecalciferol (VITAMIN D) 1000 UNITS tablet Take 3,000 Units by mouth daily.     . Doxylamine Succinate, Sleep, (SLEEP AID PO) Take by mouth.    .  mirabegron ER (MYRBETRIQ) 25 MG TB24 tablet Take 1 tablet (25 mg total) by mouth daily. One po qd 30 tablet 2  . Probiotic Product (PHILLIPS COLON HEALTH PO) Take 1 tablet by mouth daily.      No current facility-administered medications for this visit.    Family History  Problem Relation Age of Onset  . Heart attack Mother   . Hypertension Father   . Hypertension Sister   . Coronary artery disease Sister   . Hypertension Brother   . Coronary artery disease Brother   . Diabetes Brother   . Diabetes Maternal Aunt   . Breast cancer Paternal Aunt   . Coronary artery disease Maternal Grandfather   . Diabetes Maternal Grandfather   . Diabetes Paternal Grandmother   . Diabetes Maternal Grandmother   . Depression Maternal Grandmother   . Diabetes Brother     Review of Systems  All other systems reviewed and are negative.   Exam:   BP 120/78 (BP Location: Left Arm, Patient Position: Sitting, Cuff Size: Normal)   Pulse 81   Ht 5\' 4"  (1.626 m)   Wt 162 lb (73.5 kg)   LMP 12/11/2009   BMI 27.81 kg/m  Height: 5\' 4"  (162.6 cm)  General appearance: alert, cooperative and appears stated age Breasts: normal appearance, no masses or tenderness Abdomen: soft, non-tender; bowel sounds normal; no masses,  no organomegaly Lymph nodes: Cervical, supraclavicular, and axillary nodes normal.  No abnormal inguinal nodes palpated Neurologic: Grossly normal  Pelvic: External genitalia:  no lesions              Urethra:  normal appearing urethra with no masses, tenderness or lesions              Bartholins and Skenes: normal                 Vagina: normal appearing vagina with normal color and discharge, no lesions, second degree cystocele noted              Cervix: absent              Pap taken: No. Bimanual Exam:  Uterus:  uterus absent              Adnexa: no mass, fullness, tenderness               Rectovaginal: Confirms               Anus:  normal sphincter tone, no  lesions  Chaperone, Cydney Ok, CMA, was present for exam.  Assessment/Plan: 1. GYN exam for high-risk Medicare patient - pap smear not indicated - MMG 07/12/2019 - BMD 8/19 - Colonoscopy 2016.  Follow up 10 years.  I do not have this in chart.  Release signed.  2. Endometrial adenocarcinoma [facility]) - Oct, 2011 was 10 year mark  3. Osteopenia, unspecified location - DG BONE DENSITY (DXA); Future  4. Family history of cardiovascular disease - pt has multiple family members with CVD and brother who died last year - Ambulatory referral to Cardiology  5. Female cystocele - do not recommend any surgical intervention at this time  6. Urge incontinence of urine - rx for myrbetriq 25mg  daily given.  #30/1Rf.  Side effect of elevate BP specifically discussed.  Pt will monitor at home and call with any changes.

## 2020-04-24 ENCOUNTER — Ambulatory Visit: Payer: Medicare PPO

## 2020-05-28 NOTE — Progress Notes (Signed)
Cardiology Office Note:    Date:  05/29/2020   ID:  Haley Thompson, DOB Oct 05, 1950, MRN [id number]  PCP:  Glenis Smoker, MD  Cardiologist:  Buford Dresser, MD  Referring MD: Megan Salon, MD   Chief Complaint  Patient presents with  . New Patient (Initial Visit)  . Headache  . Edema    Hands and legs at times.    History of Present Illness:    Haley Thompson is a 70 y.o. Thompson with a hx of Asthma, Endometrioid adenocarcinoma (stage IA, grade 1), and hypercholesterolemia,  who is seen as a new consult at the request of Megan Salon, MD for the evaluation and management of risk for CVD. She was last seen by Dr. Sabra Heck 04/10/2020 for breast and pelvic exam. The patient reported a strong family history of CVD and her brother died last year at 85 yo.  Dr. Sabra Heck opted for ambulatory referral to cardiology and noted coronary CT may be beneficial.  Today: No specific concerns beyond assessment of her CV risk and recommendations for management.  Cardiovascular risk factors: Estimated 10-year risk score 8.3% Prior clinical ASCVD: none Comorbid conditions, including hypertension, hyperlipidemia, diabetes, chronic kidney disease: None Metabolic syndrome/Obesity: Highest adult weight 166, currently 157 lbs Chronic inflammatory conditions: None Tobacco use history: Never smoker Family history: Her brother died of heart disease 12/04/19 at 62 yo. Her mother died at 106 yo from a massive heart attack. Her maternal grandmother died at 72 yo presumably of heart attack. Her uncle died at 86 yo with heart disease. Her oldest brother has had 2 bypass surgeries. Her sister has had an angioplasty. Her siblings were dx with heart disease in their 25's. Her father developed hypertension in his 68's. Diabetes is also prominent in her family.  Prior cardiac testing and/or incidental findings on other testing (ie coronary calcium): Exercise level: Walking 4 days/wk for 2 miles Current  diet: Trying to control portions and carbs, needs to work on fruits/vegetables, Enjoys almonds and chicken, rarely has red meats,  Overall she appears to be well today. She notes her blood pressure is typically stable and well controlled. She has never been told that she has diabetes.  Lately she has lost 5 pounds and continues to work on controlling portions and carbs. She walks about 4 days a week for 2 miles. Occasionally notices swelling in her hands and feet when walking and travelling in an airplane. This dissipates quickly and she is not overly concerned.   She takes tylenol for arthritis in her hands. In 1998 she was dx with asthma and used an inhaler. Today, she very rarely needs to use the inhaler. Also, her regimen includes 20 mg of atorvastatin since about 2001, which she continues to tolerate. Two years ago her baby aspirin was discontinued.  She denies any chest pain, shortness of breath, palpitations, or exertional symptoms. No headaches, lightheadedness, or syncope to report. Also has no orthopnea or PND.   Past Medical History:  Diagnosis Date  . Asthma    mild  . Complex endometrial hyperplasia with atypia   . Endometrioid adenocarcinoma    stage IA, grade 1  . Hematuria 2016   saw urologist-? passed small stone  . Hypercholesterolemia     Past Surgical History:  Procedure Laterality Date  . CHOLECYSTECTOMY  1972  . DILATION AND CURETTAGE OF UTERUS  10/2009  . LAPAROSCOPIC TOTAL HYSTERECTOMY  12/2009   with BSO, pelvic LND  . TUBAL LIGATION  Current Medications: Current Outpatient Medications on File Prior to Visit  Medication Sig  . atorvastatin (LIPITOR) 20 MG tablet Take 20 mg by mouth daily at 6 PM.   . BIOTIN PO Take 1 tablet by mouth daily.   . Calcium Carb-Cholecalciferol 600-200 MG-UNIT TABS Take 1 tablet by mouth daily.  . cholecalciferol (VITAMIN D) 1000 UNITS tablet Take 3,000 Units by mouth daily.   . Doxylamine Succinate, Sleep, (SLEEP AID PO)  Take by mouth.  . mirabegron ER (MYRBETRIQ) 25 MG TB24 tablet Take 1 tablet (25 mg total) by mouth daily. One po qd  . Probiotic Product (PHILLIPS COLON HEALTH PO) Take 1 tablet by mouth daily.    No current facility-administered medications on file prior to visit.     Allergies:   Patient has no known allergies.   Social History   Tobacco Use  . Smoking status: Never Smoker  . Smokeless tobacco: Never Used  Vaping Use  . Vaping Use: Never used  Substance Use Topics  . Alcohol use: No    Alcohol/week: 0.0 standard drinks  . Drug use: No    Family History: family history includes Breast cancer in her paternal aunt; Coronary artery disease in her brother, maternal grandfather, and sister; Depression in her maternal grandmother; Diabetes in her brother, brother, maternal aunt, maternal grandfather, maternal grandmother, and paternal grandmother; Heart attack in her mother; Heart disease in her brother; Hypertension in her brother, father, and sister.  ROS:   Please see the history of present illness.  Additional pertinent ROS: Constitutional: Negative for chills, fever, night sweats, unintentional weight loss  HENT: Negative for ear pain and hearing loss.   Eyes: Negative for loss of vision and eye pain.  Respiratory: Negative for cough, sputum, wheezing.   Cardiovascular: See HPI. Gastrointestinal: Negative for abdominal pain, melena, and hematochezia.  Genitourinary: Negative for dysuria and hematuria.  Musculoskeletal: Negative for falls and myalgias.  Skin: Negative for itching and rash.  Neurological: Negative for focal weakness, focal sensory changes and loss of consciousness.  Endo/Heme/Allergies: Does not bruise/bleed easily.     EKGs/Labs/Other Studies Reviewed:    The following studies were reviewed today: No prior CV studies.  EKG:   05/29/2020: NSR at 63 bpm with iRBBB  Recent Labs: No results found for requested labs within last 8760 hours.  Recent Lipid  Panel No results found for: CHOL, TRIG, HDL, CHOLHDL, VLDL, LDLCALC, LDLDIRECT  Physical Exam:    VS:  BP 114/72 (BP Location: Left Arm, Patient Position: Sitting, Cuff Size: Normal)   Pulse 63   Ht 5\' 4"  (1.626 m)   Wt 157 lb (71.2 kg)   LMP 12/11/2009   BMI 26.95 kg/m     Wt Readings from Last 3 Encounters:  05/29/20 157 lb (71.2 kg)  04/10/20 162 lb (73.5 kg)  02/05/19 162 lb (73.5 kg)    GEN: Well nourished, well developed in no acute distress HEENT: Normal, moist mucous membranes NECK: No JVD CARDIAC: regular rhythm, normal S1 and S2, no rubs or gallops. No murmur. VASCULAR: Radial and DP pulses 2+ bilaterally. No carotid bruits RESPIRATORY:  Clear to auscultation without rales, wheezing or rhonchi  ABDOMEN: Soft, non-tender, non-distended MUSCULOSKELETAL:  Ambulates independently SKIN: Warm and dry, no edema NEUROLOGIC:  Alert and oriented x 3. No focal neuro deficits noted. PSYCHIATRIC:  Normal affect    ASSESSMENT:    1. Family history of heart disease   2. Cardiac risk counseling   3. Counseling on health promotion  and disease prevention   4. Pure hypercholesterolemia    PLAN:    Cardiac risk counseling ASCVD risk score: 8.3%, optimal 7.7% BMI 26, working on portion control and walking. On statin.  She does not have any active cardiac symptoms, so stress test/CT coronary/cath not indicated. Reviewed calcium score at length, but as she is already on atorvastatin, this would not likely change management.   We also reviewed aspirin guidelines at length. While she does not meet guidelines for recommending aspirin, we did discuss that this is a personal decision. Reviewed risk/benefit. She will think about it.  Counseled on red flag warning signs that need immediate medical attention.   I am happy to see her back if any questions or concerns arise.   Family history of heart disease Heart disease on father's and mother's side. Father did not have heart  disease, but many of his family did. Paternal uncle died age 41 of MI. Other paternal siblings had heart disease but passed away later in life (28 siblings total). Mother died age 3 of massive MI (had no siblings), first MI age 2. Significant obesity on maternal side. Oldest brother developed heart disease at 45, has had two different CABG surgeries, alive age 78. Still living. Sister had angioplasty, developed late 40s/early 74s. Brother recently deceased, had prior CABG, had sudden cardiac death at age 36). High cholesterol runs in the family, but has not had genetics. Hypertension, diabetes runs in family.  Pure hypercholesterolemia Has been on atorvastatin 20 mg since ~2001.  Cardiac risk counseling and prevention recommendations: -recommend heart healthy/Mediterranean diet, with whole grains, fruits, vegetable, fish, lean meats, nuts, and olive oil. Limit salt. -recommend moderate walking, 3-5 times/week for 30-50 minutes each session. Aim for at least 150 minutes.week. Goal should be pace of 3 miles/hours, or walking 1.5 miles in 30 minutes -recommend avoidance of tobacco products. Avoid excess alcohol.  Plan for follow up: As needed.  Buford Dresser, MD, PhD, [facility]    Medication Adjustments/Labs and Tests Ordered: Current medicines are reviewed at length with the patient today.  Concerns regarding medicines are outlined above.  Orders Placed This Encounter  Procedures  . EKG 12-Lead   No orders of the defined types were placed in this encounter.   Patient Instructions  Medication Instructions:  Your Physician recommend you continue on your current medication as directed.    *If you need a refill on your cardiac medications before your next appointment, please call your pharmacy*   Lab Work: None   Testing/Procedures: None   Follow-Up: At [facility], you and your health needs are our priority.  As part of our continuing mission to  provide you with exceptional heart care, we have created designated Provider Care Teams.  These Care Teams include your primary Cardiologist (physician) and Advanced Practice Providers (APPs -  Physician Assistants and Nurse Practitioners) who all work together to provide you with the care you need, when you need it.  We recommend signing up for the patient portal called "MyChart".  Sign up information is provided on this After Visit Summary.  MyChart is used to connect with patients for Virtual Visits (Telemedicine).  Patients are able to view lab/test results, encounter notes, upcoming appointments, etc.  Non-urgent messages can be sent to your provider as well.   To learn more about what you can do with MyChart, go to [email or web address].    Your next appointment:   As needed @ [facility]  [place], [place] [address]   The format for your next appointment:   In Person  Provider:   Buford Dresser, MD        [facility] Stumpf,acting as a scribe for Buford Dresser, MD.,have documented all relevant documentation on the behalf of Buford Dresser, MD,as directed by  Buford Dresser, MD while in the presence of Buford Dresser, MD.  I, Buford Dresser, MD, have reviewed all documentation for this visit. The documentation on 05/29/20 for the exam, diagnosis, procedures, and orders are all accurate and complete.  Signed, Buford Dresser, MD PhD 05/29/2020 6:18 PM    [facility]

## 2020-05-29 ENCOUNTER — Other Ambulatory Visit: Payer: Self-pay

## 2020-05-29 ENCOUNTER — Ambulatory Visit: Payer: Medicare HMO | Admitting: Cardiology

## 2020-05-29 ENCOUNTER — Encounter: Payer: Self-pay | Admitting: Cardiology

## 2020-05-29 VITALS — BP 114/72 | HR 63 | Ht 64.0 in | Wt 157.0 lb

## 2020-05-29 DIAGNOSIS — Z7189 Other specified counseling: Secondary | ICD-10-CM | POA: Diagnosis not present

## 2020-05-29 DIAGNOSIS — Z8249 Family history of ischemic heart disease and other diseases of the circulatory system: Secondary | ICD-10-CM | POA: Diagnosis not present

## 2020-05-29 DIAGNOSIS — E7800 Pure hypercholesterolemia, unspecified: Secondary | ICD-10-CM | POA: Diagnosis not present

## 2020-05-29 NOTE — Assessment & Plan Note (Signed)
Has been on atorvastatin 20 mg since ~2001.

## 2020-05-29 NOTE — Assessment & Plan Note (Addendum)
Heart disease on father's and mother's side. Father did not have heart disease, but many of his family did. Paternal uncle died age 72 of MI. Other paternal siblings had heart disease but passed away later in life (34 siblings total). Mother died age 72 of massive MI (had no siblings), first MI age 58. Significant obesity on maternal side. Oldest brother developed heart disease at 6, has had two different CABG surgeries, alive age 45. Still living. Sister had angioplasty, developed late 40s/early 29s. Brother recently deceased, had prior CABG, had sudden cardiac death at age 56). High cholesterol runs in the family, but has not had genetics. Hypertension, diabetes runs in family.

## 2020-05-29 NOTE — Assessment & Plan Note (Addendum)
ASCVD risk score: 8.3%, optimal 7.7% BMI 26, working on portion control and walking. On statin.  She does not have any active cardiac symptoms, so stress test/CT coronary/cath not indicated. Reviewed calcium score at length, but as she is already on atorvastatin, this would not likely change management.   We also reviewed aspirin guidelines at length. While she does not meet guidelines for recommending aspirin, we did discuss that this is a personal decision. Reviewed risk/benefit. She will think about it.  Counseled on red flag warning signs that need immediate medical attention.   I am happy to see her back if any questions or concerns arise.

## 2020-05-29 NOTE — Patient Instructions (Addendum)
Medication Instructions:  Your Physician recommend you continue on your current medication as directed.    *If you need a refill on your cardiac medications before your next appointment, please call your pharmacy*   Lab Work: None   Testing/Procedures: None   Follow-Up: At [facility], you and your health needs are our priority.  As part of our continuing mission to provide you with exceptional heart care, we have created designated Provider Care Teams.  These Care Teams include your primary Cardiologist (physician) and Advanced Practice Providers (APPs -  Physician Assistants and Nurse Practitioners) who all work together to provide you with the care you need, when you need it.  We recommend signing up for the patient portal called "MyChart".  Sign up information is provided on this After Visit Summary.  MyChart is used to connect with patients for Virtual Visits (Telemedicine).  Patients are able to view lab/test results, encounter notes, upcoming appointments, etc.  Non-urgent messages can be sent to your provider as well.   To learn more about what you can do with MyChart, go to [email or web address].    Your next appointment:   As needed @ [address] [place], [place] [address]   The format for your next appointment:   In Person  Provider:   Buford Dresser, MD

## 2020-07-15 ENCOUNTER — Other Ambulatory Visit: Payer: Self-pay | Admitting: Family Medicine

## 2020-07-15 DIAGNOSIS — Z1231 Encounter for screening mammogram for malignant neoplasm of breast: Secondary | ICD-10-CM

## 2020-08-20 DIAGNOSIS — Z7184 Encounter for health counseling related to travel: Secondary | ICD-10-CM | POA: Diagnosis not present

## 2020-08-20 DIAGNOSIS — Z0000 Encounter for general adult medical examination without abnormal findings: Secondary | ICD-10-CM | POA: Diagnosis not present

## 2020-08-20 DIAGNOSIS — Z8249 Family history of ischemic heart disease and other diseases of the circulatory system: Secondary | ICD-10-CM | POA: Diagnosis not present

## 2020-08-20 DIAGNOSIS — E559 Vitamin D deficiency, unspecified: Secondary | ICD-10-CM | POA: Diagnosis not present

## 2020-08-20 DIAGNOSIS — M8588 Other specified disorders of bone density and structure, other site: Secondary | ICD-10-CM | POA: Diagnosis not present

## 2020-08-20 DIAGNOSIS — E7800 Pure hypercholesterolemia, unspecified: Secondary | ICD-10-CM | POA: Diagnosis not present

## 2020-09-04 ENCOUNTER — Other Ambulatory Visit: Payer: Self-pay

## 2020-09-04 ENCOUNTER — Ambulatory Visit
Admission: RE | Admit: 2020-09-04 | Discharge: 2020-09-04 | Disposition: A | Discharge status: Home / Self Care | Payer: Medicare HMO | Source: Ambulatory Visit | Attending: Family Medicine | Admitting: Family Medicine

## 2020-09-04 DIAGNOSIS — Z1231 Encounter for screening mammogram for malignant neoplasm of breast: Secondary | ICD-10-CM | POA: Diagnosis not present

## 2020-10-23 ENCOUNTER — Other Ambulatory Visit (HOSPITAL_BASED_OUTPATIENT_CLINIC_OR_DEPARTMENT_OTHER): Payer: Self-pay | Admitting: Obstetrics & Gynecology

## 2020-10-23 DIAGNOSIS — N3941 Urge incontinence: Secondary | ICD-10-CM

## 2020-10-23 DIAGNOSIS — M8580 Other specified disorders of bone density and structure, unspecified site: Secondary | ICD-10-CM

## 2020-11-19 DIAGNOSIS — I788 Other diseases of capillaries: Secondary | ICD-10-CM | POA: Diagnosis not present

## 2020-11-19 DIAGNOSIS — L720 Epidermal cyst: Secondary | ICD-10-CM | POA: Diagnosis not present

## 2020-11-19 DIAGNOSIS — L821 Other seborrheic keratosis: Secondary | ICD-10-CM | POA: Diagnosis not present

## 2020-11-19 DIAGNOSIS — L578 Other skin changes due to chronic exposure to nonionizing radiation: Secondary | ICD-10-CM | POA: Diagnosis not present

## 2020-11-19 DIAGNOSIS — L918 Other hypertrophic disorders of the skin: Secondary | ICD-10-CM | POA: Diagnosis not present

## 2020-11-19 DIAGNOSIS — L814 Other melanin hyperpigmentation: Secondary | ICD-10-CM | POA: Diagnosis not present

## 2020-11-19 DIAGNOSIS — L820 Inflamed seborrheic keratosis: Secondary | ICD-10-CM | POA: Diagnosis not present

## 2020-11-19 DIAGNOSIS — D1801 Hemangioma of skin and subcutaneous tissue: Secondary | ICD-10-CM | POA: Diagnosis not present

## 2020-11-19 DIAGNOSIS — L570 Actinic keratosis: Secondary | ICD-10-CM | POA: Diagnosis not present

## 2020-11-19 DIAGNOSIS — L920 Granuloma annulare: Secondary | ICD-10-CM | POA: Diagnosis not present

## 2020-12-08 DIAGNOSIS — E7800 Pure hypercholesterolemia, unspecified: Secondary | ICD-10-CM | POA: Diagnosis not present

## 2020-12-08 DIAGNOSIS — H52229 Regular astigmatism, unspecified eye: Secondary | ICD-10-CM | POA: Diagnosis not present

## 2021-04-01 ENCOUNTER — Other Ambulatory Visit (HOSPITAL_BASED_OUTPATIENT_CLINIC_OR_DEPARTMENT_OTHER): Payer: Self-pay | Admitting: Obstetrics & Gynecology

## 2021-04-01 DIAGNOSIS — N3941 Urge incontinence: Secondary | ICD-10-CM

## 2021-04-01 DIAGNOSIS — M8580 Other specified disorders of bone density and structure, unspecified site: Secondary | ICD-10-CM

## 2021-04-02 ENCOUNTER — Other Ambulatory Visit: Payer: Medicare HMO

## 2021-04-30 ENCOUNTER — Ambulatory Visit (HOSPITAL_BASED_OUTPATIENT_CLINIC_OR_DEPARTMENT_OTHER): Payer: Medicare HMO | Admitting: Obstetrics & Gynecology

## 2021-05-14 ENCOUNTER — Encounter (HOSPITAL_BASED_OUTPATIENT_CLINIC_OR_DEPARTMENT_OTHER): Payer: Self-pay | Admitting: Obstetrics & Gynecology

## 2021-05-14 ENCOUNTER — Ambulatory Visit (INDEPENDENT_AMBULATORY_CARE_PROVIDER_SITE_OTHER): Payer: Medicare PPO | Admitting: Obstetrics & Gynecology

## 2021-05-14 VITALS — BP 130/72 | HR 67 | Ht 63.0 in | Wt 138.4 lb

## 2021-05-14 DIAGNOSIS — N8110 Cystocele, unspecified: Secondary | ICD-10-CM | POA: Diagnosis not present

## 2021-05-14 DIAGNOSIS — N819 Female genital prolapse, unspecified: Secondary | ICD-10-CM

## 2021-05-14 DIAGNOSIS — R3915 Urgency of urination: Secondary | ICD-10-CM | POA: Diagnosis not present

## 2021-05-14 DIAGNOSIS — C541 Malignant neoplasm of endometrium: Secondary | ICD-10-CM

## 2021-05-14 DIAGNOSIS — Z01419 Encounter for gynecological examination (general) (routine) without abnormal findings: Secondary | ICD-10-CM

## 2021-05-14 DIAGNOSIS — Z8544 Personal history of malignant neoplasm of other female genital organs: Secondary | ICD-10-CM

## 2021-05-14 DIAGNOSIS — M8580 Other specified disorders of bone density and structure, unspecified site: Secondary | ICD-10-CM

## 2021-05-14 NOTE — Progress Notes (Signed)
71 y.o. G3P2 Married White or Caucasian female here for breast and pelvic exam.  I am also following her for h/o endometrial cancer treated surgically in 2011.   ? ?Going to [place] next Tuesday to meet her first great grandchild.   ? ?Denies vaginal bleeding. ? ?Purposefully lost weight this last year.  She did this purposefully and it took about a year.  Lost around 25#.   ? ?Continues to have urinary urgency and incomplete emptying.  Has not had a recent UTI.  Sometimes intercourse is difficult and she has discomfort.  We discussed PT (which she has done in the past) and surgical correction.  She is ready for referral at this time. ? ?Patient's last menstrual period was 12/11/2009.          ?Sexually active: Yes.    ?H/O STD:  no ? ?Health Maintenance: ?PCP:  Dr. Lindell Noe.  Last wellness appt was 08/2020.  Did blood work at that appt:  yes ?Vaccines are up to date:  yes ?Colonoscopy:  2016 follow up 10 years ?MMG:  09/04/2020 Negative ?BMD:  10/25/2019, -2.0.  Has follow up scheduled 10/2021 ?Last pap smear:  04/25/2014 Negative.   ?H/o abnormal pap smear:  no ? ? reports that she has never smoked. She has never used smokeless tobacco. She reports that she does not drink alcohol and does not use drugs. ? ?Past Medical History:  ?Diagnosis Date  ? Asthma   ? mild  ? Complex endometrial hyperplasia with atypia   ? Endometrioid adenocarcinoma   ? stage IA, grade 1  ? Hematuria 2016  ? saw urologist-? passed small stone  ? Hypercholesterolemia   ? ? ?Past Surgical History:  ?Procedure Laterality Date  ? CHOLECYSTECTOMY  1972  ? DILATION AND CURETTAGE OF UTERUS  10/2009  ? LAPAROSCOPIC TOTAL HYSTERECTOMY  12/2009  ? with BSO, pelvic LND  ? TUBAL LIGATION    ? ? ?Current Outpatient Medications  ?Medication Sig Dispense Refill  ? atorvastatin (LIPITOR) 20 MG tablet Take 20 mg by mouth daily at 6 PM.     ? BIOTIN PO Take 1 tablet by mouth daily.     ? Calcium Carb-Cholecalciferol 600-200 MG-UNIT TABS Take 1 tablet by mouth  daily.    ? cholecalciferol (VITAMIN D) 1000 UNITS tablet Take 3,000 Units by mouth daily.     ? Doxylamine Succinate, Sleep, (SLEEP AID PO) Take by mouth.    ? Probiotic Product (PHILLIPS COLON HEALTH PO) Take 1 tablet by mouth daily.     ? ?No current facility-administered medications for this visit.  ? ? ?Family History  ?Problem Relation Age of Onset  ? Heart attack Mother   ? Hypertension Father   ? Hypertension Sister   ? Coronary artery disease Sister   ? Hypertension Brother   ? Coronary artery disease Brother   ? Diabetes Brother   ? Diabetes Maternal Aunt   ? Breast cancer Paternal Aunt   ? Coronary artery disease Maternal Grandfather   ? Diabetes Maternal Grandfather   ? Diabetes Paternal Grandmother   ? Diabetes Maternal Grandmother   ? Depression Maternal Grandmother   ? Diabetes Brother   ? Heart disease Brother   ? ? ?Review of Systems  ?All other systems reviewed and are negative. ? ?Exam:   ?BP 130/72 (BP Location: Left Arm, Patient Position: Sitting, Cuff Size: Large)   Pulse 67   Ht '5\' 3"'$  (1.6 m) Comment: reported  Wt 138 lb 6.4 oz (62.8  kg)   LMP 12/11/2009   BMI 24.52 kg/m?   Height: '5\' 3"'$  (160 cm) (reported) ? ?General appearance: alert, cooperative and appears stated age ?Breasts: normal appearance, no masses or tenderness ?Abdomen: soft, non-tender; bowel sounds normal; no masses,  no organomegaly ?Lymph nodes: Cervical, supraclavicular, and axillary nodes normal.  No abnormal inguinal nodes palpated ?Neurologic: Grossly normal ? ?Pelvic: External genitalia:  no lesions ?             Urethra:  normal appearing urethra with no masses, tenderness or lesions ?             Bartholins and Skenes: normal    ?             Vagina: normal appearing vagina with atrophic changes and no discharge, no lesions ?             Cervix: absent ?             Pap taken: No. ?Bimanual Exam:  Uterus:  uterus absent ?             Adnexa: no mass, fullness, tenderness ?              Rectovaginal: Confirms ?               Anus:  normal sphincter tone, no lesions ? ?Chaperone, Tonya, CMA, was present for exam. ? ?Assessment/Plan: ?1. Encntr for gyn exam (general) (routine) w/o abn findings ?- pap smear not indicated ?- MMG 08/2020 ?- BMD scheduled for 10/2021 ?- colonoscopy 2016 ?- blood work done with Dr. Lindell Noe ? ?2. Endometrial adenocarcinoma ([place]) ?- treated in 2011 ? ?3. Female cystocele ? ?4. Osteopenia, unspecified location ? ?5. Vaginal vault prolapse ?- Ambulatory referral to Urogynecology ?- has done pelvic PT already with some improvement in urinary urgency symptoms ? ?6. Urinary urgency ? ? ? ?

## 2021-05-17 DIAGNOSIS — N819 Female genital prolapse, unspecified: Secondary | ICD-10-CM | POA: Insufficient documentation

## 2021-08-17 ENCOUNTER — Other Ambulatory Visit: Payer: Self-pay | Admitting: Family Medicine

## 2021-08-17 DIAGNOSIS — Z1231 Encounter for screening mammogram for malignant neoplasm of breast: Secondary | ICD-10-CM

## 2021-08-27 DIAGNOSIS — E7800 Pure hypercholesterolemia, unspecified: Secondary | ICD-10-CM | POA: Diagnosis not present

## 2021-08-27 DIAGNOSIS — Z8249 Family history of ischemic heart disease and other diseases of the circulatory system: Secondary | ICD-10-CM | POA: Diagnosis not present

## 2021-08-27 DIAGNOSIS — E559 Vitamin D deficiency, unspecified: Secondary | ICD-10-CM | POA: Diagnosis not present

## 2021-08-27 DIAGNOSIS — G4700 Insomnia, unspecified: Secondary | ICD-10-CM | POA: Diagnosis not present

## 2021-08-27 DIAGNOSIS — Z79899 Other long term (current) drug therapy: Secondary | ICD-10-CM | POA: Diagnosis not present

## 2021-08-27 DIAGNOSIS — R7301 Impaired fasting glucose: Secondary | ICD-10-CM | POA: Diagnosis not present

## 2021-08-27 DIAGNOSIS — Z0000 Encounter for general adult medical examination without abnormal findings: Secondary | ICD-10-CM | POA: Diagnosis not present

## 2021-08-27 DIAGNOSIS — M8588 Other specified disorders of bone density and structure, other site: Secondary | ICD-10-CM | POA: Diagnosis not present

## 2021-10-06 ENCOUNTER — Encounter: Payer: Self-pay | Admitting: Obstetrics and Gynecology

## 2021-10-06 ENCOUNTER — Ambulatory Visit: Payer: Medicare PPO | Admitting: Obstetrics and Gynecology

## 2021-10-06 VITALS — BP 118/78 | HR 64 | Ht 62.5 in | Wt 136.0 lb

## 2021-10-06 DIAGNOSIS — K5904 Chronic idiopathic constipation: Secondary | ICD-10-CM

## 2021-10-06 DIAGNOSIS — N8110 Cystocele, unspecified: Secondary | ICD-10-CM | POA: Diagnosis not present

## 2021-10-06 DIAGNOSIS — N993 Prolapse of vaginal vault after hysterectomy: Secondary | ICD-10-CM | POA: Diagnosis not present

## 2021-10-06 DIAGNOSIS — R3915 Urgency of urination: Secondary | ICD-10-CM | POA: Diagnosis not present

## 2021-10-06 DIAGNOSIS — R350 Frequency of micturition: Secondary | ICD-10-CM

## 2021-10-06 DIAGNOSIS — N952 Postmenopausal atrophic vaginitis: Secondary | ICD-10-CM | POA: Diagnosis not present

## 2021-10-06 LAB — POCT URINALYSIS DIPSTICK
Bilirubin, UA: NEGATIVE
Glucose, UA: NEGATIVE
Ketones, UA: NEGATIVE
Leukocytes, UA: NEGATIVE
Nitrite, UA: NEGATIVE
Protein, UA: NEGATIVE
Spec Grav, UA: >=1.03 — AB (ref 1.010–1.025)
Urobilinogen, UA: 0.2 E.U./dL
pH, UA: 5 (ref 5.0–8.0)

## 2021-10-06 MED ORDER — ESTRADIOL 0.1 MG/GM VA CREA: TOPICAL_CREAM | VAGINAL | 11 refills | Status: DC

## 2021-10-06 NOTE — Patient Instructions (Addendum)
You have a stage 2 (out of 4) prolapse.  We discussed the fact that it is not life threatening but there are several treatment options. For treatment of pelvic organ prolapse, we discussed options for management including expectant management, conservative management, and surgical management, such as Kegels, a pessary, pelvic floor physical therapy, and specific surgical procedures.     Today we talked about ways to manage bladder urgency such as altering your diet to avoid irritative beverages and foods (bladder diet) as well as attempting to decrease stress and other exacerbating factors.    The Most Bothersome Foods* The Least Bothersome Foods*  Coffee - Regular & Decaf Tea - caffeinated Carbonated beverages - cola, non-colas, diet & caffeine-free Alcohols - Beer, Red Wine, White Wine, Champagne Fruits - Grapefruit, Mustang, Orange, [employer] - Cranberry, Grapefruit, Orange, Pineapple Vegetables - Tomato & Tomato Products Flavor Enhancers - Hot peppers, Spicy foods, Chili, Horseradish, Vinegar, Monosodium glutamate (MSG) Artificial Sweeteners - NutraSweet, Sweet 'N Low, Equal (sweetener), Saccharin Ethnic foods - [place], [place], [place] food [employer] - low-fat & whole Fruits - Bananas, Blueberries, Honeydew melon, Pears, Raisins, Watermelon Vegetables - Broccoli, Brussels Sprouts, [place], Carrots, Cauliflower, [place], Cucumber, Mushrooms, Peas, Radishes, Squash, Zucchini, White potatoes, Sweet potatoes & yams Poultry - Chicken, Eggs, [place], [employer] - Beef, Programmer, multimedia, Lamb Seafood - Shrimp, [place] fish, Salmon Grains - Oat, Rice Snacks - Pretzels, Popcorn  *Lissa Morales et al. Diet and its role in interstitial cystitis/bladder pain syndrome (IC/BPS) and comorbid conditions. [place] 2012 Jan 11.

## 2021-10-06 NOTE — Progress Notes (Signed)
[facility] New Patient Evaluation and Consultation  Referring Provider: Megan Salon, MD PCP: Glenis Smoker, MD Date of Service: 10/06/2021  SUBJECTIVE Chief Complaint: New Patient (Initial Visit) Haley Thompson is a 71 y.o. female complains of urinary leakage, urgency and prolapse./)  History of Present Illness: Haley Thompson is a 71 y.o. White or Caucasian female seen in consultation at the request of Dr. Sabra Heck for evaluation of prolapse and incontinence.    Review of records from Dr Sabra Heck significant for: Has urinary urgency and leakage. Has tried pelvic PT. Started on Myrbetriq.  Has history of endometrial carcinoma.  Urinary Symptoms: Leaks urine with going from sitting to standing, with a full bladder, and with urgency. Does not have leakage with cough/ sneeze.  Leaks occasionally- not every day. Has urgency frequently though- has to run to the bathroom.  Pad use:  liners/ mini-pads  She is bothered by her UI symptoms. She did not take Myrbetriq. Has taken Azo over the counter.   Day time voids 6-8.  Nocturia: 2-3 times per night to void. Voiding dysfunction: she does not empty her bladder well.  does not use a catheter to empty bladder.  When urinating, she feels a weak stream and the need to urinate multiple times in a row Drinks: 2 cups decaf coffee, abt 16-24 oz plain water, carbonated water per day, soda (coke) a few times per day She does drink up until bedtime. She is on trazadone for sleep but it makes her thirsty and sometimes drinks in the middle of the night.   UTIs:  0  UTI's in the last year.   Denies history of blood in urine and kidney or bladder stones  Pelvic Organ Prolapse Symptoms:                  She Admits to a feeling of a bulge the vaginal area. It has been present for 10 years.  She Denies seeing a bulge.  This bulge is bothersome, especially in the last few years.  Bowel Symptom: Bowel movements: 3-4 time(s) per  week- changes based on diet Stool consistency: hard Straining: yes.  Splinting: no.  Incomplete evacuation: yes.  She Denies accidental bowel leakage / fecal incontinence Bowel regimen: stool softener daily, miralax when needed   Sexual Function Sexually active: yes.  Sexual orientation:  heterosexual Pain with sex: has discomfort due to prolapse, has discomfort due to dryness She uses lubricant but still has irritation.   Pelvic Pain Denies pelvic pain   Past Medical History:  Past Medical History:  Diagnosis Date   Asthma    mild   Complex endometrial hyperplasia with atypia    Endometrioid adenocarcinoma    stage IA, grade 1   Hematuria 2016   saw urologist-? passed small stone   Hypercholesterolemia      Past Surgical History:   Past Surgical History:  Procedure Laterality Date   CHOLECYSTECTOMY  1972   DILATION AND CURETTAGE OF UTERUS  10/2009   LAPAROSCOPIC TOTAL HYSTERECTOMY  12/2009   with BSO, pelvic LND   TUBAL LIGATION       Past OB/GYN History: OB History  Gravida Para Term Preterm AB Living  '3 2     1 2  '$ SAB IAB Ectopic Multiple Live Births  1            # Outcome Date GA Lbr Len/2nd Weight Sex Delivery Anes PTL Lv  3 Para 03/1975 [redacted]w[redacted]d 8 lb  11 oz (3.941 kg) M      2 Para 08/1970 [redacted]w[redacted]d 8 lb 9 oz (3.884 kg) M      1 SAB             Vaginal deliveries: 2,  Forceps/ Vacuum deliveries: unsure if she had forceps with first delivery, Cesarean section: 0 S/p hsyterectomy   Medications: She has a current medication list which includes the following prescription(s): atorvastatin, biotin, calcium carb-cholecalciferol, cholecalciferol, [START ON 10/08/2021] estradiol, probiotic product, and trazodone.   Allergies: Patient has No Known Allergies.   Social History:  Social History   Tobacco Use   Smoking status: Never   Smokeless tobacco: Never  Vaping Use   Vaping Use: Never used  Substance Use Topics   Alcohol use: No    Alcohol/week: 0.0  standard drinks of alcohol   Drug use: No    Relationship status: married She lives with husband.   She is not employed. Regular exercise: Yes: walking 2 miles 3-4 times per week History of abuse: No  Family History:   Family History  Problem Relation Age of Onset   Heart attack Mother    Hypertension Father    Hypertension Sister    Coronary artery disease Sister    Hypertension Brother    Coronary artery disease Brother    Diabetes Brother    Diabetes Maternal Aunt    Breast cancer Paternal Aunt    Coronary artery disease Maternal Grandfather    Diabetes Maternal Grandfather    Diabetes Paternal Grandmother    Diabetes Maternal Grandmother    Depression Maternal Grandmother    Diabetes Brother    Heart disease Brother      Review of Systems: Review of Systems  Constitutional:  Negative for fever, malaise/fatigue and weight loss.  Respiratory:  Negative for cough, shortness of breath and wheezing.   Cardiovascular:  Negative for chest pain, palpitations and leg swelling.  Gastrointestinal:  Negative for abdominal pain and blood in stool.  Genitourinary:  Negative for dysuria.  Musculoskeletal:  Negative for myalgias.  Skin:  Negative for rash.  Neurological:  Negative for dizziness and headaches.  Endo/Heme/Allergies:  Does not bruise/bleed easily.       + hot flashes  Psychiatric/Behavioral:  Negative for depression. The patient is not nervous/anxious.      OBJECTIVE Physical Exam: Vitals:   10/06/21 1433  BP: 118/78  Pulse: 64  Weight: 136 lb (61.7 kg)  Height: 5' 2.5" (1.588 m)    Physical Exam Constitutional:      General: She is not in acute distress. Pulmonary:     Effort: Pulmonary effort is normal.  Abdominal:     General: There is no distension.     Palpations: Abdomen is soft.     Tenderness: There is no abdominal tenderness. There is no rebound.  Musculoskeletal:        General: No swelling. Normal range of motion.  Skin:    General:  Skin is warm and dry.     Findings: No rash.  Neurological:     Mental Status: She is alert and oriented to person, place, and time.  Psychiatric:        Mood and Affect: Mood normal.        Behavior: Behavior normal.      GU / Detailed Urogynecologic Evaluation:  Pelvic Exam: Normal external female genitalia; Bartholin's and Skene's glands normal in appearance; urethral meatus normal in appearance, no urethral masses or discharge.  CST: negative  s/p hysterectomy: Speculum exam reveals normal vaginal mucosa with  atrophy and normal vaginal cuff.  Adnexa no mass, fullness, tenderness.     Pelvic floor strength II/V  Pelvic floor musculature: Right levator non-tender, Right obturator non-tender, Left levator non-tender, Left obturator non-tender  POP-Q:   POP-Q  -1                                            Aa   -1                                           Ba  -5.5                                              C   3.5                                            Gh  4                                            Pb  6.5                                            tvl   -2.5                                            Ap  -2.5                                            Bp                                                 D     Rectal Exam:  Normal external rectum  Post-Void Residual (PVR) by Bladder Scan: In order to evaluate bladder emptying, we discussed obtaining a postvoid residual and she agreed to this procedure.  Procedure: The ultrasound unit was placed on the patient's abdomen in the suprapubic region after the patient had voided. A PVR of 25 ml was obtained by bladder scan.  Laboratory Results: POC urine: trace blood, otherwise negative   ASSESSMENT AND PLAN Ms. Edsall is a 71 y.o. with:  1. Prolapse of anterior vaginal wall   2. Vaginal vault prolapse after hysterectomy   3. Urinary urgency   4. Vaginal atrophy   5. Chronic idiopathic constipation     Stage II anterior, Stage I  posterior, Stage I apical prolapse - For treatment of pelvic organ prolapse, we discussed options for management including expectant management, conservative management, and surgical management, such as Kegels, a pessary, pelvic floor physical therapy, and specific surgical procedures. - We discussed two options for prolapse repair:  1) vaginal repair without mesh - Pros - safer, no mesh complications - Cons - not as strong as mesh repair, higher risk of recurrence  2) laparoscopic repair with mesh - Pros - stronger, better long-term success - Cons - risks of mesh implant (erosion into vagina or bladder, adhering to the rectum, pain) - these risks are lower than with a vaginal mesh but still exist - She prefers a vaginal procedure- recommend anterior repair with sacrospinous ligament fixation.  - She will return for simple CMG to evaluate for any occult SUI.   2. Vaginal atrophy - start estrace cream 0.5g nightly for two weeks then twice a week after.   3. Constipation - For constipation, we reviewed the importance of a better bowel regimen.  We also discussed the importance of avoiding chronic straining, as it can exacerbate her pelvic floor symptoms; we discussed treating constipation and straining prior to surgery, as postoperative straining can lead to damage to the repair and recurrence of symptoms.  - Recommend taking miralax daily  Return for simple CMG  Jaquita Folds, MD

## 2021-10-27 DIAGNOSIS — D2272 Melanocytic nevi of left lower limb, including hip: Secondary | ICD-10-CM | POA: Diagnosis not present

## 2021-10-27 DIAGNOSIS — D485 Neoplasm of uncertain behavior of skin: Secondary | ICD-10-CM | POA: Diagnosis not present

## 2021-10-27 DIAGNOSIS — D225 Melanocytic nevi of trunk: Secondary | ICD-10-CM | POA: Diagnosis not present

## 2021-10-27 DIAGNOSIS — D1801 Hemangioma of skin and subcutaneous tissue: Secondary | ICD-10-CM | POA: Diagnosis not present

## 2021-10-27 DIAGNOSIS — L918 Other hypertrophic disorders of the skin: Secondary | ICD-10-CM | POA: Diagnosis not present

## 2021-10-27 DIAGNOSIS — D2271 Melanocytic nevi of right lower limb, including hip: Secondary | ICD-10-CM | POA: Diagnosis not present

## 2021-10-27 DIAGNOSIS — L920 Granuloma annulare: Secondary | ICD-10-CM | POA: Diagnosis not present

## 2021-10-27 DIAGNOSIS — L720 Epidermal cyst: Secondary | ICD-10-CM | POA: Diagnosis not present

## 2021-10-27 DIAGNOSIS — L821 Other seborrheic keratosis: Secondary | ICD-10-CM | POA: Diagnosis not present

## 2021-10-29 ENCOUNTER — Ambulatory Visit
Admission: RE | Admit: 2021-10-29 | Discharge: 2021-10-29 | Disposition: A | Discharge status: Home / Self Care | Payer: Medicare PPO | Source: Ambulatory Visit | Attending: Obstetrics & Gynecology | Admitting: Obstetrics & Gynecology

## 2021-10-29 ENCOUNTER — Ambulatory Visit
Admission: RE | Admit: 2021-10-29 | Discharge: 2021-10-29 | Disposition: A | Discharge status: Home / Self Care | Payer: Medicare PPO | Source: Ambulatory Visit | Attending: Family Medicine | Admitting: Family Medicine

## 2021-10-29 DIAGNOSIS — Z780 Asymptomatic menopausal state: Secondary | ICD-10-CM | POA: Diagnosis not present

## 2021-10-29 DIAGNOSIS — N3941 Urge incontinence: Secondary | ICD-10-CM

## 2021-10-29 DIAGNOSIS — M8580 Other specified disorders of bone density and structure, unspecified site: Secondary | ICD-10-CM

## 2021-10-29 DIAGNOSIS — Z1231 Encounter for screening mammogram for malignant neoplasm of breast: Secondary | ICD-10-CM | POA: Diagnosis not present

## 2021-10-29 DIAGNOSIS — M8589 Other specified disorders of bone density and structure, multiple sites: Secondary | ICD-10-CM | POA: Diagnosis not present

## 2021-10-30 ENCOUNTER — Ambulatory Visit (INDEPENDENT_AMBULATORY_CARE_PROVIDER_SITE_OTHER): Payer: Medicare PPO | Admitting: Obstetrics and Gynecology

## 2021-10-30 ENCOUNTER — Encounter: Payer: Self-pay | Admitting: Obstetrics and Gynecology

## 2021-10-30 VITALS — BP 110/74 | HR 64

## 2021-10-30 DIAGNOSIS — N993 Prolapse of vaginal vault after hysterectomy: Secondary | ICD-10-CM | POA: Diagnosis not present

## 2021-10-30 DIAGNOSIS — N8110 Cystocele, unspecified: Secondary | ICD-10-CM

## 2021-10-30 NOTE — Progress Notes (Signed)
[facility] Return Visit  SUBJECTIVE  History of Present Illness: Haley Thompson is a 71 y.o. female seen in follow-up for simple CMG and to further discuss surgery.   Has been taking miralax for constipation.   Past Medical History: Patient  has a past medical history of Asthma, Complex endometrial hyperplasia with atypia, Endometrioid adenocarcinoma, Hematuria (2016), and Hypercholesterolemia.   Past Surgical History: She  has a past surgical history that includes Laparoscopic total hysterectomy (12/2009); Cholecystectomy (1972); Tubal ligation; and Dilation and curettage of uterus (10/2009).   Medications: She has a current medication list which includes the following prescription(s): atorvastatin, biotin, calcium carb-cholecalciferol, cholecalciferol, estradiol, probiotic product, and trazodone.   Allergies: Patient has No Known Allergies.   Social History: Patient  reports that she has never smoked. She has never used smokeless tobacco. She reports that she does not drink alcohol and does not use drugs.      OBJECTIVE     Physical Exam: Vitals:   10/30/21 0931  BP: 110/74  Pulse: 64   Gen: No apparent distress, A&O x 3.  Detailed Urogynecologic Evaluation:  Normal external genitalia.  Prior exam showed:  POP-Q:    POP-Q   -1                                            Aa   -1                                           Ba   -5.5                                              C    3.5                                            Gh   4                                            Pb   6.5                                            tvl    -2.5                                            Ap   -2.5                                            Bp  D      Verbal consent was obtained to perform simple CMG procedure:   Prolapse was reduced using 2 large cotton swabs. Urethra was prepped with  betadine and a 17F catheter was placed and bladder was drained completely. The bladder was then backfilled with sterile water by gravity.  First sensation: 80 First Desire: 150 Strong Desire: 220 Capacity: 280 Cough stress test was negative. Valsalva stress test was negative. Performed in sitting and standing positions.  No DO was noted with filling. She was was allowed to void on her own.   Interpretation: CMG showed normal sensation, and normal cystometric capacity. Findings negative for stress incontinence, negative for detrusor overactivity.     ASSESSMENT AND PLAN    Ms. Belmares is a 71 y.o. with:  1. Prolapse of anterior vaginal wall   2. Vaginal vault prolapse after hysterectomy    - Previously had wanted a vaginal procedure but she wants to review all options. Reviewed options of sacrocolpopexy with mesh vs vaginal native tissue repair (anterior repair with sacospinous fixation). Handouts provided on both options and she will let us know what she decides. She is looking to have surgery in the new year.  - She reports she is still also having some straining with bowel movements. We discussed she does not have a significant posterior vaginal prolapse on exam, but when she has surgery, we can look again and add a posterior repair if necessary. Continue miralax.   - Medical clearance: not required  - Anticoagulant use: No - Medicaid Hysterectomy form: n/a - Accepts blood transfusion: will confirm at pre op - Expected length of stay: outpatient    Jaquita Folds, MD  Time spent: I spent 20 minutes dedicated to the care of this patient on the date of this encounter to include pre-visit review of records, face-to-face time with the patient discussing surgery and post visit documentation in addition to the procedure.

## 2021-11-03 ENCOUNTER — Encounter: Payer: Self-pay | Admitting: Obstetrics and Gynecology

## 2022-01-15 ENCOUNTER — Encounter: Payer: Self-pay | Admitting: Obstetrics and Gynecology

## 2022-01-15 ENCOUNTER — Ambulatory Visit (INDEPENDENT_AMBULATORY_CARE_PROVIDER_SITE_OTHER): Payer: Medicare Other | Admitting: Obstetrics and Gynecology

## 2022-01-15 VITALS — BP 126/76 | HR 66 | Wt 136.0 lb

## 2022-01-15 DIAGNOSIS — N8110 Cystocele, unspecified: Secondary | ICD-10-CM

## 2022-01-15 DIAGNOSIS — Z01818 Encounter for other preprocedural examination: Secondary | ICD-10-CM

## 2022-01-15 DIAGNOSIS — N993 Prolapse of vaginal vault after hysterectomy: Secondary | ICD-10-CM | POA: Diagnosis not present

## 2022-01-15 NOTE — Progress Notes (Unsigned)
Haley Thompson Pre-Operative visit  Subjective Chief Complaint: Haley Thompson presents for a preoperative encounter.   History of Present Illness: Haley Thompson is a 72 y.o. female who presents for preoperative visit.  She is scheduled to undergo Exam under anesthesia, anterior repair, sacrospinous ligament fixation, cystoscopy, possible posterior repair on 02/01/22.  Her symptoms include vaginal bulge, and she was was found to have Stage II anterior, Stage I posterior, Stage I apical prolapse.   CMG showed normal sensation, and normal cystometric capacity. Findings negative for stress incontinence, negative for detrusor overactivity.   Past Medical History:  Diagnosis Date   Asthma    mild   Complex endometrial hyperplasia with atypia    Endometrioid adenocarcinoma    stage IA, grade 1   Hematuria 2016   saw urologist-? passed small stone   Hypercholesterolemia      Past Surgical History:  Procedure Laterality Date   CHOLECYSTECTOMY  1972   DILATION AND CURETTAGE OF UTERUS  10/2009   LAPAROSCOPIC TOTAL HYSTERECTOMY  12/2009   with BSO, pelvic LND   TUBAL LIGATION      has No Known Allergies.   Family History  Problem Relation Age of Onset   Heart attack Mother    Hypertension Father    Hypertension Sister    Coronary artery disease Sister    Hypertension Brother    Coronary artery disease Brother    Diabetes Brother    Diabetes Maternal Aunt    Breast cancer Paternal Aunt    Coronary artery disease Maternal Grandfather    Diabetes Maternal Grandfather    Diabetes Paternal Grandmother    Diabetes Maternal Grandmother    Depression Maternal Grandmother    Diabetes Brother    Heart disease Brother     Social History   Tobacco Use   Smoking status: Never   Smokeless tobacco: Never  Vaping Use   Vaping Use: Never used  Substance Use Topics   Alcohol use: No    Alcohol/week: 0.0 standard drinks of alcohol   Drug use: No     Review of  Systems was negative for a full 10 system review except as noted in the History of Present Illness.   Current Outpatient Medications:    atorvastatin (LIPITOR) 20 MG tablet, Take 20 mg by mouth daily at 6 PM. , Disp: , Rfl:    BIOTIN PO, Take 1 tablet by mouth daily. , Disp: , Rfl:    Calcium Carb-Cholecalciferol 600-200 MG-UNIT TABS, Take 1 tablet by mouth daily., Disp: , Rfl:    cholecalciferol (VITAMIN D) 1000 UNITS tablet, Take 3,000 Units by mouth daily. , Disp: , Rfl:    estradiol (ESTRACE) 0.1 MG/GM vaginal cream, Place 0.5g nightly for two weeks then twice a week after, Disp: 30 g, Rfl: 11   Probiotic Product ([place] PO), Take 1 tablet by mouth daily. , Disp: , Rfl:    traZODone (DESYREL) 50 MG tablet, Take 50 mg by mouth at bedtime., Disp: , Rfl:    Objective There were no vitals filed for this visit.  Gen: NAD CV: S1 S2 RRR Lungs: Clear to auscultation bilaterally Abd: soft, nontender   Previous Pelvic Exam showed: POP-Q:    POP-Q   -1  Aa   -1                                           Ba   -5.5                                              C    3.5                                            Gh   4                                            Pb   6.5                                            tvl    -2.5                                            Ap   -2.5                                            Bp                                                  D       Assessment/ Plan  Assessment: The patient is a 72 y.o. year old scheduled to undergo Exam under anesthesia, anterior repair, sacrospinous ligament fixation, cystoscopy, possible posterior repair. Verbal consent was obtained for these procedures.  Plan: General Surgical Consent: The patient has previously been counseled on alternative treatments, and the decision by the patient and provider was to proceed with the procedure listed above.  For  all procedures, there are risks of bleeding, infection, damage to surrounding organs including but not limited to bowel, bladder, blood vessels, ureters and nerves, and need for further surgery if an injury were to occur. These risks are all low with minimally invasive surgery.   There are risks of numbness and weakness at any body site or buttock/rectal pain.  It is possible that baseline pain can be worsened by surgery, either with or without mesh. If surgery is vaginal, there is also a low risk of possible conversion to laparoscopy or open abdominal incision where indicated. Very rare risks include blood transfusion, blood clot, heart attack, pneumonia, or death.   There is also a risk of short-term postoperative urinary retention with need to use a catheter. About half of patients need to go home from surgery with a catheter, which is  then later removed in the office. The risk of long-term need for a catheter is very low. There is also a risk of worsening of overactive bladder.   ***Sling: The effectiveness of a midurethral vaginal mesh sling is approximately 85%, and thus, there will be times when you may leak urine after surgery, especially if your bladder is full or if you have a strong cough. There is a balance between making the sling tight enough to treat your leakage but not too tight so that you have long-term difficulty emptying your bladder. A mesh sling will not directly treat overactive bladder/urge incontinence and may worsen it.  There is an FDA safety notification on vaginal mesh procedures for prolapse but NOT mesh slings. We have extensive experience and training with mesh placement and we have close postoperative follow up to identify any potential complications from mesh. It is important to realize that this mesh is a permanent implant that cannot be easily removed. There are rare risks of mesh exposure (2-4%), pain with intercourse (0-7%), and infection (<1%). The risk of mesh exposure  if more likely in a woman with risks for poor healing (prior radiation, poorly controlled diabetes, or immunocompromised). The risk of new or worsened chronic pain after mesh implant is more common in women with baseline chronic pain and/or poorly controlled anxiety or depression. Approximately 2-4% of patients will experience longer-term post-operative voiding dysfunction that may require surgical revision of the sling. We also reviewed that postoperatively, her stream may not be as strong as before surgery.   *** Prolapse (with or without mesh): Risk factors for surgical failure  include things that put pressure on your pelvis and the surgical repair, including obesity, chronic cough, and heavy lifting or straining (including lifting children or adults, straining on the toilet, or lifting heavy objects such as furniture or anything weighing >25 lbs. Risks of recurrence is 20-30% with vaginal native tissue repair and a less than 10% with sacrocolpopexy with mesh.     We discussed consent for blood products. Risks for blood transfusion include allergic reactions, other reactions that can affect different body organs and managed accordingly, transmission of infectious diseases such as HIV or Hepatitis. However, the blood is screened. Patient consents for blood products.  Pre-operative instructions:  She was instructed to not take Aspirin/NSAIDs x 7days prior to surgery. She may continue her '81mg'$  ASA.*** Antibiotic prophylaxis was ordered as indicated.  Catheter use: Patient will go home with foley if needed after post-operative voiding trial.  Post-operative instructions:  She was provided with specific post-operative instructions, including precautions and signs/symptoms for which we would recommend contacting us, in addition to daytime and after-hours contact phone numbers. This was provided on a handout.   Post-operative medications: ***Prescriptions for motrin, tylenol, miralax, and oxycodone were  sent to her pharmacy. Discussed using ibuprofen and tylenol on a schedule to limit use of narcotics.   Laboratory testing:  We will check labs: ***. Day of surgery UPT***  Preoperative clearance:  She {does/does not:19886} require surgical clearance.    Post-operative follow-up:  A post-operative appointment will be made for 6 weeks from the date of surgery. If she needs a post-operative nurse visit for a voiding trial, that will be set up after she leaves the hospital.    Patient will call the clinic or use MyChart should anything change or any new issues arise.   Jaquita Folds, MD   ***OR orders

## 2022-01-17 ENCOUNTER — Encounter: Payer: Self-pay | Admitting: Obstetrics and Gynecology

## 2022-01-17 MED ORDER — POLYETHYLENE GLYCOL 3350 17 GM/SCOOP PO POWD: 17.0000 g | Freq: Every day | ORAL | 0 refills | Status: AC

## 2022-01-17 MED ORDER — ACETAMINOPHEN 500 MG PO TABS: 500.0000 mg | ORAL_TABLET | Freq: Four times a day (QID) | ORAL | 0 refills | Status: AC | PRN

## 2022-01-17 MED ORDER — IBUPROFEN 600 MG PO TABS: 600.0000 mg | ORAL_TABLET | Freq: Four times a day (QID) | ORAL | 0 refills | Status: AC | PRN

## 2022-01-17 MED ORDER — OXYCODONE HCL 5 MG PO TABS: 5.0000 mg | ORAL_TABLET | ORAL | 0 refills | Status: DC | PRN

## 2022-01-17 NOTE — H&P (Signed)
[facility] Pre-Operative visit  Subjective Chief Complaint: Haley Thompson presents for a preoperative encounter.   History of Present Illness: Haley Thompson is a 72 y.o. female who presents for preoperative visit.  She is scheduled to undergo Exam under anesthesia, anterior repair, sacrospinous ligament fixation, cystoscopy, possible posterior repair on 02/01/22.  Her symptoms include vaginal bulge, and she was was found to have Stage II anterior, Stage I posterior, Stage I apical prolapse.   CMG showed normal sensation, and normal cystometric capacity. Findings negative for stress incontinence, negative for detrusor overactivity.   Past Medical History:  Diagnosis Date   Asthma    mild   Complex endometrial hyperplasia with atypia    Endometrioid adenocarcinoma    stage IA, grade 1   Hematuria 2016   saw urologist-? passed small stone   Hypercholesterolemia      Past Surgical History:  Procedure Laterality Date   CHOLECYSTECTOMY  1972   DILATION AND CURETTAGE OF UTERUS  10/2009   LAPAROSCOPIC TOTAL HYSTERECTOMY  12/2009   with BSO, pelvic LND   TUBAL LIGATION      has No Known Allergies.   Family History  Problem Relation Age of Onset   Heart attack Mother    Hypertension Father    Hypertension Sister    Coronary artery disease Sister    Hypertension Brother    Coronary artery disease Brother    Diabetes Brother    Diabetes Maternal Aunt    Breast cancer Paternal Aunt    Coronary artery disease Maternal Grandfather    Diabetes Maternal Grandfather    Diabetes Paternal Grandmother    Diabetes Maternal Grandmother    Depression Maternal Grandmother    Diabetes Brother    Heart disease Brother     Social History   Tobacco Use   Smoking status: Never   Smokeless tobacco: Never  Vaping Use   Vaping Use: Never used  Substance Use Topics   Alcohol use: No    Alcohol/week: 0.0 standard drinks of alcohol   Drug use: No     Review of  Systems was negative for a full 10 system review except as noted in the History of Present Illness.  No current facility-administered medications for this encounter.  Current Outpatient Medications:    acetaminophen (TYLENOL) 500 MG tablet, Take 1 tablet (500 mg total) by mouth every 6 (six) hours as needed (pain)., Disp: 30 tablet, Rfl: 0   atorvastatin (LIPITOR) 20 MG tablet, Take 20 mg by mouth daily at 6 PM. , Disp: , Rfl:    BIOTIN PO, Take 1 tablet by mouth daily. , Disp: , Rfl:    Calcium Carb-Cholecalciferol 600-200 MG-UNIT TABS, Take 1 tablet by mouth daily., Disp: , Rfl:    cholecalciferol (VITAMIN D) 1000 UNITS tablet, Take 3,000 Units by mouth daily. , Disp: , Rfl:    estradiol (ESTRACE) 0.1 MG/GM vaginal cream, Place 0.5g nightly for two weeks then twice a week after, Disp: 30 g, Rfl: 11   ibuprofen (ADVIL) 600 MG tablet, Take 1 tablet (600 mg total) by mouth every 6 (six) hours as needed., Disp: 30 tablet, Rfl: 0   oxyCODONE (OXY IR/ROXICODONE) 5 MG immediate release tablet, Take 1 tablet (5 mg total) by mouth every 4 (four) hours as needed for severe pain., Disp: 10 tablet, Rfl: 0   polyethylene glycol powder (GLYCOLAX/MIRALAX) 17 GM/SCOOP powder, Take 17 g by mouth daily. Drink 17g (1 scoop) dissolved in water per day., Disp: 270  g, Rfl: 0   Probiotic Product (PHILLIPS COLON HEALTH PO), Take 1 tablet by mouth daily. , Disp: , Rfl:    traZODone (DESYREL) 50 MG tablet, Take 50 mg by mouth at bedtime., Disp: , Rfl:    Objective There were no vitals filed for this visit.  Gen: NAD CV: S1 S2 RRR Lungs: Clear to auscultation bilaterally Abd: soft, nontender   Previous Pelvic Exam showed: POP-Q:    POP-Q   -1                                            Aa   -1                                           Ba   -5.5                                              C    3.5                                            Gh   4                                            Pb   6.5                                             tvl    -2.5                                            Ap   -2.5                                            Bp                                                  D       Assessment/ Plan The patient is a 71 y.o. year old scheduled to undergo Exam under anesthesia, anterior repair, sacrospinous ligament fixation, cystoscopy, possible posterior repair.    Jaquita Folds, MD

## 2022-01-26 ENCOUNTER — Encounter (HOSPITAL_BASED_OUTPATIENT_CLINIC_OR_DEPARTMENT_OTHER): Payer: Self-pay | Admitting: Obstetrics and Gynecology

## 2022-01-26 NOTE — Progress Notes (Signed)
Spoke w/ via phone for pre-op interview--- Haley Thompson needs dos----NONE               Thompson results------ COVID test -----patient states asymptomatic no test needed Arrive at -------0900 NPO after MN NO Solid Food.  Clear liquids from MN until---0800 Med rec completed Medications to take morning of surgery -----NONE Diabetic medication ----- Patient instructed no nail polish to be worn day of surgery Patient instructed to bring photo id and insurance card day of surgery Patient aware to have Driver (ride ) / caregiver  Husband Haley Thompson  for 24 hours after surgery  Patient Special Instructions ----- Pre-Op special Istructions ----- Patient verbalized understanding of instructions that were given at this phone interview. Patient denies shortness of breath, chest pain, fever, cough at this phone interview.

## 2022-01-30 NOTE — Anesthesia Preprocedure Evaluation (Addendum)
Anesthesia Evaluation  Patient identified by MRN, date of birth, ID band Patient awake    Reviewed: Allergy & Precautions, NPO status , Patient's Chart, lab work & pertinent test results  History of Anesthesia Complications Negative for: history of anesthetic complications  Airway Mallampati: I  TM Distance: >3 FB Neck ROM: Full    Dental no notable dental hx.    Pulmonary asthma    Pulmonary exam normal        Cardiovascular negative cardio ROS Normal cardiovascular exam     Neuro/Psych negative neurological ROS  negative psych ROS   GI/Hepatic negative GI ROS, Neg liver ROS,,,  Endo/Other  negative endocrine ROS    Renal/GU negative Renal ROS  negative genitourinary   Musculoskeletal negative musculoskeletal ROS (+)    Abdominal   Peds  Hematology negative hematology ROS (+)   Anesthesia Other Findings Day of surgery medications reviewed with patient.  Reproductive/Obstetrics Endometrial ca                              Anesthesia Physical Anesthesia Plan  ASA: 2  Anesthesia Plan: General   Post-op Pain Management: Tylenol PO (pre-op)*   Induction: Intravenous  PONV Risk Score and Plan: 3 and Treatment may vary due to age or medical condition, Ondansetron, Dexamethasone and Midazolam  Airway Management Planned: LMA  Additional Equipment: None  Intra-op Plan:   Post-operative Plan: Extubation in OR  Informed Consent: I have reviewed the patients History and Physical, chart, labs and discussed the procedure including the risks, benefits and alternatives for the proposed anesthesia with the patient or authorized representative who has indicated his/her understanding and acceptance.     Dental advisory given  Plan Discussed with: CRNA  Anesthesia Plan Comments:         Anesthesia Quick Evaluation

## 2022-02-01 ENCOUNTER — Telehealth: Payer: Self-pay | Admitting: Obstetrics and Gynecology

## 2022-02-01 ENCOUNTER — Ambulatory Visit (HOSPITAL_BASED_OUTPATIENT_CLINIC_OR_DEPARTMENT_OTHER): Payer: Medicare Other | Admitting: Anesthesiology

## 2022-02-01 ENCOUNTER — Ambulatory Visit (HOSPITAL_BASED_OUTPATIENT_CLINIC_OR_DEPARTMENT_OTHER)
Admission: RE | Admit: 2022-02-01 | Discharge: 2022-02-01 | Disposition: A | Discharge status: Home / Self Care | Payer: Medicare Other | Attending: Obstetrics and Gynecology | Admitting: Obstetrics and Gynecology

## 2022-02-01 ENCOUNTER — Encounter (HOSPITAL_BASED_OUTPATIENT_CLINIC_OR_DEPARTMENT_OTHER): Payer: Self-pay | Admitting: Obstetrics and Gynecology

## 2022-02-01 ENCOUNTER — Other Ambulatory Visit: Payer: Self-pay

## 2022-02-01 ENCOUNTER — Encounter (HOSPITAL_BASED_OUTPATIENT_CLINIC_OR_DEPARTMENT_OTHER)
Admission: RE | Disposition: A | Discharge status: Home / Self Care | Payer: Self-pay | Source: Home / Self Care | Attending: Obstetrics and Gynecology

## 2022-02-01 DIAGNOSIS — N993 Prolapse of vaginal vault after hysterectomy: Secondary | ICD-10-CM | POA: Diagnosis not present

## 2022-02-01 DIAGNOSIS — N8111 Cystocele, midline: Secondary | ICD-10-CM

## 2022-02-01 HISTORY — PX: ANTERIOR AND POSTERIOR REPAIR WITH SACROSPINOUS FIXATION: SHX6536

## 2022-02-01 HISTORY — PX: CYSTOSCOPY: SHX5120

## 2022-02-01 SURGERY — ANTERIOR AND POSTERIOR REPAIR WITH SACROSPINOUS FIXATION
Anesthesia: General | Site: Vagina

## 2022-02-01 MED ORDER — KETOROLAC TROMETHAMINE 30 MG/ML IJ SOLN
INTRAMUSCULAR | Status: AC
  Filled 2022-02-01: qty 1

## 2022-02-01 MED ORDER — GABAPENTIN 300 MG PO CAPS
300.0000 mg | ORAL_CAPSULE | ORAL | Status: AC
  Administered 2022-02-01: 300 mg via ORAL

## 2022-02-01 MED ORDER — FENTANYL CITRATE (PF) 100 MCG/2ML IJ SOLN: 25.0000 ug | INTRAMUSCULAR | Status: DC | PRN

## 2022-02-01 MED ORDER — FENTANYL CITRATE (PF) 100 MCG/2ML IJ SOLN
INTRAMUSCULAR | Status: AC
  Filled 2022-02-01: qty 2

## 2022-02-01 MED ORDER — DEXAMETHASONE SODIUM PHOSPHATE 10 MG/ML IJ SOLN
INTRAMUSCULAR | Status: DC | PRN
  Administered 2022-02-01: 5 mg via INTRAVENOUS

## 2022-02-01 MED ORDER — ACETAMINOPHEN 500 MG PO TABS: 1000.0000 mg | ORAL_TABLET | ORAL | Status: DC

## 2022-02-01 MED ORDER — LACTATED RINGERS IV SOLN: INTRAVENOUS | Status: DC

## 2022-02-01 MED ORDER — ACETAMINOPHEN 500 MG PO TABS
ORAL_TABLET | ORAL | Status: AC
  Filled 2022-02-01: qty 2

## 2022-02-01 MED ORDER — CEFAZOLIN SODIUM-DEXTROSE 2-4 GM/100ML-% IV SOLN
2.0000 g | INTRAVENOUS | Status: AC
  Administered 2022-02-01: 2 g via INTRAVENOUS

## 2022-02-01 MED ORDER — OXYCODONE HCL 5 MG PO TABS: 5.0000 mg | ORAL_TABLET | Freq: Once | ORAL | Status: DC | PRN

## 2022-02-01 MED ORDER — ACETAMINOPHEN 500 MG PO TABS
1000.0000 mg | ORAL_TABLET | Freq: Once | ORAL | Status: AC
  Administered 2022-02-01: 1000 mg via ORAL

## 2022-02-01 MED ORDER — LIDOCAINE HCL (PF) 2 % IJ SOLN
INTRAMUSCULAR | Status: AC
  Filled 2022-02-01: qty 5

## 2022-02-01 MED ORDER — OXYCODONE HCL 5 MG/5ML PO SOLN: 5.0000 mg | Freq: Once | ORAL | Status: DC | PRN

## 2022-02-01 MED ORDER — ONDANSETRON HCL 4 MG/2ML IJ SOLN
INTRAMUSCULAR | Status: DC | PRN
  Administered 2022-02-01: 4 mg via INTRAVENOUS

## 2022-02-01 MED ORDER — 0.9 % SODIUM CHLORIDE (POUR BTL) OPTIME
TOPICAL | Status: DC | PRN
  Administered 2022-02-01: 500 mL

## 2022-02-01 MED ORDER — ONDANSETRON HCL 4 MG/2ML IJ SOLN
INTRAMUSCULAR | Status: AC
  Filled 2022-02-01: qty 2

## 2022-02-01 MED ORDER — LIDOCAINE-EPINEPHRINE 1 %-1:100000 IJ SOLN
INTRAMUSCULAR | Status: DC | PRN
  Administered 2022-02-01: 17 mL

## 2022-02-01 MED ORDER — EPHEDRINE SULFATE-NACL 50-0.9 MG/10ML-% IV SOSY
PREFILLED_SYRINGE | INTRAVENOUS | Status: DC | PRN
  Administered 2022-02-01 (×2): 10 mg via INTRAVENOUS

## 2022-02-01 MED ORDER — PHENAZOPYRIDINE HCL 100 MG PO TABS
200.0000 mg | ORAL_TABLET | ORAL | Status: AC
  Administered 2022-02-01: 200 mg via ORAL

## 2022-02-01 MED ORDER — DEXAMETHASONE SODIUM PHOSPHATE 10 MG/ML IJ SOLN
INTRAMUSCULAR | Status: AC
  Filled 2022-02-01: qty 1

## 2022-02-01 MED ORDER — AMISULPRIDE (ANTIEMETIC) 5 MG/2ML IV SOLN: 10.0000 mg | Freq: Once | INTRAVENOUS | Status: DC | PRN

## 2022-02-01 MED ORDER — GABAPENTIN 300 MG PO CAPS
ORAL_CAPSULE | ORAL | Status: AC
  Filled 2022-02-01: qty 1

## 2022-02-01 MED ORDER — EPHEDRINE 5 MG/ML INJ
INTRAVENOUS | Status: AC
  Filled 2022-02-01: qty 5

## 2022-02-01 MED ORDER — SODIUM CHLORIDE 0.9 % IR SOLN
Status: DC | PRN
  Administered 2022-02-01: 200 mL via INTRAVESICAL

## 2022-02-01 MED ORDER — CEFAZOLIN SODIUM-DEXTROSE 2-4 GM/100ML-% IV SOLN
INTRAVENOUS | Status: AC
  Filled 2022-02-01: qty 100

## 2022-02-01 MED ORDER — PROPOFOL 10 MG/ML IV BOLUS
INTRAVENOUS | Status: DC | PRN
  Administered 2022-02-01: 100 mg via INTRAVENOUS

## 2022-02-01 MED ORDER — LIDOCAINE 2% (20 MG/ML) 5 ML SYRINGE
INTRAMUSCULAR | Status: DC | PRN
  Administered 2022-02-01: 60 mg via INTRAVENOUS

## 2022-02-01 MED ORDER — PHENAZOPYRIDINE HCL 100 MG PO TABS
ORAL_TABLET | ORAL | Status: AC
  Filled 2022-02-01: qty 2

## 2022-02-01 MED ORDER — FENTANYL CITRATE (PF) 100 MCG/2ML IJ SOLN
INTRAMUSCULAR | Status: DC | PRN
  Administered 2022-02-01: 50 ug via INTRAVENOUS
  Administered 2022-02-01 (×2): 25 ug via INTRAVENOUS

## 2022-02-01 MED ORDER — PROPOFOL 10 MG/ML IV BOLUS
INTRAVENOUS | Status: AC
  Filled 2022-02-01: qty 20

## 2022-02-01 MED ORDER — KETOROLAC TROMETHAMINE 30 MG/ML IJ SOLN
INTRAMUSCULAR | Status: DC | PRN
  Administered 2022-02-01: 30 mg via INTRAVENOUS

## 2022-02-01 SURGICAL SUPPLY — 38 items
AGENT HMST KT MTR STRL THRMB (HEMOSTASIS)
BLADE CLIPPER SENSICLIP SURGIC (BLADE) IMPLANT
BLADE SURG 15 STRL LF DISP TIS (BLADE) ×2 IMPLANT
BLADE SURG 15 STRL SS (BLADE) ×2
DEVICE CAPIO SLIM SINGLE (INSTRUMENTS) IMPLANT
ELECT REM PT RETURN 9FT ADLT (ELECTROSURGICAL) ×2
ELECTRODE REM PT RTRN 9FT ADLT (ELECTROSURGICAL) IMPLANT
GAUZE 4X4 16PLY ~~LOC~~+RFID DBL (SPONGE) IMPLANT
GLOVE BIO SURGEON STRL SZ 6 (GLOVE) IMPLANT
GLOVE BIOGEL PI IND STRL 6.5 (GLOVE) ×2 IMPLANT
GLOVE BIOGEL PI IND STRL 7.0 (GLOVE) ×2 IMPLANT
GLOVE ECLIPSE 6.0 STRL STRAW (GLOVE) ×2 IMPLANT
GOWN STRL REUS W/TWL LRG LVL3 (GOWN DISPOSABLE) ×2 IMPLANT
HIBICLENS CHG 4% 4OZ BTL (MISCELLANEOUS) ×2 IMPLANT
HOLDER FOLEY CATH W/STRAP (MISCELLANEOUS) ×2 IMPLANT
KIT TURNOVER CYSTO (KITS) ×2 IMPLANT
MANIFOLD NEPTUNE II (INSTRUMENTS) ×2 IMPLANT
NDL MAYO 6 CRC TAPER PT (NEEDLE) IMPLANT
NEEDLE HYPO 22GX1.5 SAFETY (NEEDLE) ×2 IMPLANT
NEEDLE MAYO 6 CRC TAPER PT (NEEDLE) ×2 IMPLANT
NS IRRIG 1000ML POUR BTL (IV SOLUTION) ×2 IMPLANT
PACK CYSTO (CUSTOM PROCEDURE TRAY) ×2 IMPLANT
PACK VAGINAL WOMENS (CUSTOM PROCEDURE TRAY) ×2 IMPLANT
PAD OB MATERNITY 4.3X12.25 (PERSONAL CARE ITEMS) ×2 IMPLANT
RETRACTOR LONE STAR DISPOSABLE (INSTRUMENTS) ×2 IMPLANT
RETRACTOR STAY HOOK 5MM (MISCELLANEOUS) ×2 IMPLANT
SET IRRIG Y TYPE TUR BLADDER L (SET/KITS/TRAYS/PACK) ×2 IMPLANT
SPIKE FLUID TRANSFER (MISCELLANEOUS) IMPLANT
SURGIFLO W/THROMBIN 8M KIT (HEMOSTASIS) IMPLANT
SUT ABS MONO DBL WITH NDL 48IN (SUTURE) IMPLANT
SUT VIC AB 0 CT1 27 (SUTURE)
SUT VIC AB 0 CT1 27XBRD ANBCTR (SUTURE) IMPLANT
SUT VIC AB 2-0 SH 27 (SUTURE)
SUT VIC AB 2-0 SH 27XBRD (SUTURE) IMPLANT
SUT VICRYL 2-0 SH 8X27 (SUTURE) ×2 IMPLANT
SYR BULB EAR ULCER 3OZ GRN STR (SYRINGE) ×2 IMPLANT
TOWEL OR 17X26 10 PK STRL BLUE (TOWEL DISPOSABLE) ×2 IMPLANT
TRAY FOLEY W/BAG SLVR 14FR LF (SET/KITS/TRAYS/PACK) ×2 IMPLANT

## 2022-02-01 NOTE — Telephone Encounter (Signed)
Haley Thompson underwent anterior repair, sacrospinous ligament fixation, cystoscopy on 02/01/22.   She failed her voiding trial.  352m was backfilled into the bladder Voided 256m  She was discharged with a catheter. Please call her for a routine post op check and to schedule a voiding trial by Thursday 1/25- Fri 1/26. Thanks!  Haley FoldsMD

## 2022-02-01 NOTE — Anesthesia Procedure Notes (Addendum)
Procedure Name: LMA Insertion Date/Time: 02/01/2022 10:58 AM  Performed by: Suan Halter, CRNAPre-anesthesia Checklist: Patient identified, Emergency Drugs available, Suction available and Patient being monitored Patient Re-evaluated:Patient Re-evaluated prior to induction Oxygen Delivery Method: Circle system utilized Preoxygenation: Pre-oxygenation with 100% oxygen Induction Type: IV induction Ventilation: Mask ventilation without difficulty LMA: LMA inserted LMA Size: 4.0 Number of attempts: 1 Airway Equipment and Method: Bite block Placement Confirmation: positive ETCO2 Tube secured with: Tape Dental Injury: Teeth and Oropharynx as per pre-operative assessment

## 2022-02-01 NOTE — Transfer of Care (Signed)
Immediate Anesthesia Transfer of Care Note  Patient: Alaysha Jefcoat  Procedure(s) Performed: Procedure(s) (LRB): ANTERIOR REPAIR WITH SACROSPINOUS FIXATION (N/A) CYSTOSCOPY (N/A)  Patient Location: PACU  Anesthesia Type: General  Level of Consciousness: awake, oriented, sedated and patient cooperative  Airway & Oxygen Therapy: Patient Spontanous Breathing and Patient connected to face mask oxygen  Post-op Assessment: Report given to PACU RN and Post -op Vital signs reviewed and stable  Post vital signs: Reviewed and stable  Complications: No apparent anesthesia complications Last Vitals:  Vitals Value Taken Time  BP 129/75 02/01/22 1215  Temp    Pulse 78 02/01/22 1215  Resp 9 02/01/22 1215  SpO2 98 % 02/01/22 1215  Vitals shown include unvalidated device data.  Last Pain:  Vitals:   02/01/22 0925  TempSrc: Oral  PainSc: 0-No pain      Patients Stated Pain Goal: 3 (68/11/57 2620)  Complications: No notable events documented.

## 2022-02-01 NOTE — Op Note (Signed)
Operative Note  Preoperative Diagnosis: anterior vaginal prolapse and vaginal vault prolapse after hysterectomy  Postoperative Diagnosis: same  Procedures performed:  Anterior repair, enterocele repair, sacrospinous ligament fixation, cystoscopy  Implants: none  Attending Surgeon: Sherlene Shams, MD  Anesthesia: General LMA  Findings: 1. On vaginal exam. Stage II anterior predominant prolapse noted   2. On cystoscopy, normal bladder and urethra without injury, lesion or foreign body. Brisk bilateral ureteral efflux noted.   Specimens: * No specimens in log *  Estimated blood loss: 100 mL  IV fluids: 300 mL  Urine output: 25 mL  Complications: none  Procedure in Detail:  After informed consent was obtained, the patient was taken to the operating room where anesthesia was induced and found to be adequate. She was placed in dorsal lithotomy position, taking care to avoid any traction on the extremities, and then prepped and draped in the usual sterile fashion. A self-retaining lonestar retractor was placed using four elastic blue stays.  After a foley catheter was inserted into the urethra, the location of the midurethra was palpated. Two Allis clamps were along the anterior vaginal wall defect. 1% lidocaine with epinephrine was injected into the vaginal mucosa.  A vertical incision was made between these two Allis clamps with a 15 blade scalpel.  Allis clamps were placed along this incision and Metzenbaum scissors were used to undermine the vaginal mucosa along the incision.  The vaginal mucosa was then sharply dissected off to the vesicovaginal septum bilaterally to the level of the pubic rami.    For the sacrospinous ligament fixation (SSLF), the ischial spine was accessed on the right side via dissection with Metzenbaum scissors and blunt dissection.  The sacrospinous ligament was palpated. Two 0 PDS suture was then placed at the sacrospinous ligament two fingerbreadths medial to  the ischial spine, in order to avoid the pudendal neurovascular bundle, using a Capio needle driver.  During dissection, the enterocele sac was entered. The enterocele was closed with a 2-0 vicryl pursestring suture. The PDS suture was attached to the vaginal epithelium on the ipsilateral side of the vaginal apex and held. Anterior plication of the vesicovaginal septum was then performed using plicating sutures of 2-0 Vicryl. The vaginal mucosal edges were trimmed and the incision reapproximated with 2-0 Vicryl in a running fashion. The SSLF suture was then tied down with excellent support of the anterior and apical vagina.  The Foley catheter was removed.  A 70-degree cystoscope was introduced, and 360-degree inspection revealed no trauma to the bladder, with bilateral ureteral efflux.  The bladder was drained and the cystoscope was removed.  The Foley catheter was reinserted. The patient tolerated the procedure well.  She was awakened from anesthesia and transferred to the recovery room in stable condition. All counts were correct x 2.    Haley Folds, MD

## 2022-02-01 NOTE — Discharge Instructions (Addendum)

## 2022-02-01 NOTE — Interval H&P Note (Signed)
History and Physical Interval Note:  02/01/2022 10:23 AM  Haley Thompson  has presented today for surgery, with the diagnosis of anterior vaginal prolapse; prolapse of vaginal vault after hysterectomy.  The various methods of treatment have been discussed with the patient and family. After consideration of risks, benefits and other options for treatment, the patient has consented to  Procedure(s) with comments: ANTERIOR REPAIR WITH SACROSPINOUS FIXATION (N/A) CYSTOSCOPY (N/A) as a surgical intervention.  The patient's history has been reviewed, patient examined, no change in status, stable for surgery.  I have reviewed the patient's chart and labs.  Questions were answered to the patient's satisfaction.     Jaquita Folds

## 2022-02-01 NOTE — Anesthesia Postprocedure Evaluation (Signed)
Anesthesia Post Note  Patient: Haley Thompson  Procedure(s) Performed: ANTERIOR REPAIR WITH SACROSPINOUS FIXATION (Vagina ) CYSTOSCOPY (Bladder)     Patient location during evaluation: PACU Anesthesia Type: General Level of consciousness: awake and alert Pain management: pain level controlled Vital Signs Assessment: post-procedure vital signs reviewed and stable Respiratory status: spontaneous breathing, nonlabored ventilation and respiratory function stable Cardiovascular status: blood pressure returned to baseline Postop Assessment: no apparent nausea or vomiting Anesthetic complications: no   No notable events documented.  Last Vitals:  Vitals:   02/01/22 0925 02/01/22 1215  BP: 138/78 129/75  Pulse: (!) 56 73  Resp: 15 (!) 9  Temp: (!) 36.2 C 36.7 C  SpO2: 100% 98%    Last Pain:  Vitals:   02/01/22 1215  TempSrc:   PainSc: 0-No pain                 Marthenia Rolling

## 2022-02-01 NOTE — Progress Notes (Signed)
Patient failed void challenge with a urine output of less than 25 ml's.  MD made aware. Foley catheter placed.  Urine output of 426m's.  Pt instructed on how to take catheter at home and in understanding prior to discharge.

## 2022-02-02 ENCOUNTER — Encounter (HOSPITAL_BASED_OUTPATIENT_CLINIC_OR_DEPARTMENT_OTHER): Payer: Self-pay | Admitting: Obstetrics and Gynecology

## 2022-02-02 NOTE — Telephone Encounter (Signed)
Called and spoke to patient: She reports she is doing well, pain reported as 2/10 and controlled well with tylenol. She is not taking any narcotic pain medication.  She has not yet had a bowel movement but reports she has taken stool softeners and Miralax and reports she normally goes every other day and had a BM yesterday prior to surgery. Denies bleeding Overall reports to be doing well.  Plan to come in Thursday morning at 0900 for voiding trial in office and catheter removal.

## 2022-02-04 ENCOUNTER — Ambulatory Visit: Payer: Medicare Other

## 2022-02-04 NOTE — Progress Notes (Signed)
Urogyn Nurse Voiding Trial Note  Haley Thompson underwent Surgery on 02/01/2022.  She presents today for a voiding trial .   Patient was identified with 2 indicators. 144m of NS was instilled into the bladder via the catheter. The catheter was removed and patient was instructed to void into the urinary hat. She voided 3052m She passed the voiding trial and a catheter was not replaced.   The patient received aftercare instructions and will follow up as scheduled.

## 2022-03-01 ENCOUNTER — Encounter: Payer: Self-pay | Admitting: *Deleted

## 2022-03-03 DIAGNOSIS — K080 Exfoliation of teeth due to systemic causes: Secondary | ICD-10-CM | POA: Diagnosis not present

## 2022-03-16 ENCOUNTER — Encounter: Payer: Self-pay | Admitting: Obstetrics and Gynecology

## 2022-03-16 ENCOUNTER — Ambulatory Visit: Payer: BC Managed Care – PPO | Admitting: Obstetrics and Gynecology

## 2022-03-16 VITALS — BP 117/78 | HR 70

## 2022-03-16 DIAGNOSIS — Z9889 Other specified postprocedural states: Secondary | ICD-10-CM

## 2022-03-16 NOTE — Progress Notes (Signed)
[facility]  Date of Visit: 03/16/2022  History of Present Illness: Ms. Ferrelli is a 72 y.o. female scheduled today for a post-operative visit.   Surgery: s/p anterior repair, sacrospinous ligament fixation, cystoscopy on 02/01/22  She did not pass her postoperative void trial. She returned on 02/04/22 and passed her voiding trial.   Postoperative course has been uncomplicated.   Today she reports she has a light spotting discharge. She is emptying her bladder well and is sleeping through the night.   UTI in the last 6 weeks? No  Pain? No  She has returned to her normal activity (except for postop restrictions) Vaginal bulge? No  Stress incontinence: No  Urgency/frequency: No  Urge incontinence: No  Voiding dysfunction: No  Bowel issues: No   Subjective Success: Do you usually have a bulge or something falling out that you can see or feel in the vaginal area? No  Retreatment Success: Any retreatment with surgery or pessary for any compartment? No    Medications: She has a current medication list which includes the following prescription(s): acetaminophen, atorvastatin, biotin, calcium carb-cholecalciferol, cetirizine, cholecalciferol, estradiol, polyethylene glycol powder, probiotic product, simethicone, trazodone, and ibuprofen.   Allergies: Patient has No Known Allergies.   Physical Exam: BP 117/78   Pulse 70   LMP 01/11/1998    Pelvic Examination: Vagina: Incisions healing well. Sutures are present at incision line and there is not granulation tissue. No tenderness along the anterior or posterior vagina. No apical tenderness. No pelvic masses.    POP-Q: POP-Q  -3                                            Aa   -3                                           Ba  -7                                              C   2.5                                            Gh  3.5                                            Pb  7                                             tvl   -3                                            Ap  -3  Bp                                                 D     ---------------------------------------------------------  Assessment and Plan:  1. Post-operative state     - Healing well. Advised to restart vaginal estrogen cream nightly for two weeks then twice a week after.  - Can resume regular activity including exercise. Wait an additional two weeks for intercourse. - Discussed avoidance of heavy lifting and straining long term to reduce the risk of recurrence.   Return as needed  Jaquita Folds, MD

## 2022-03-16 NOTE — Patient Instructions (Signed)
Start estrogen nightly for two weeks then twice a week after.

## 2022-03-24 DIAGNOSIS — H524 Presbyopia: Secondary | ICD-10-CM | POA: Diagnosis not present

## 2022-03-31 DIAGNOSIS — H52223 Regular astigmatism, bilateral: Secondary | ICD-10-CM | POA: Diagnosis not present

## 2022-06-03 ENCOUNTER — Ambulatory Visit (HOSPITAL_BASED_OUTPATIENT_CLINIC_OR_DEPARTMENT_OTHER): Payer: Medicare PPO | Admitting: Obstetrics & Gynecology

## 2022-06-24 ENCOUNTER — Encounter (HOSPITAL_BASED_OUTPATIENT_CLINIC_OR_DEPARTMENT_OTHER): Payer: Self-pay | Admitting: Obstetrics & Gynecology

## 2022-06-24 ENCOUNTER — Ambulatory Visit (INDEPENDENT_AMBULATORY_CARE_PROVIDER_SITE_OTHER): Payer: Medicare Other | Admitting: Obstetrics & Gynecology

## 2022-06-24 VITALS — BP 127/72 | HR 58 | Ht 63.5 in | Wt 134.0 lb

## 2022-06-24 DIAGNOSIS — M85851 Other specified disorders of bone density and structure, right thigh: Secondary | ICD-10-CM | POA: Diagnosis not present

## 2022-06-24 DIAGNOSIS — Z9189 Other specified personal risk factors, not elsewhere classified: Secondary | ICD-10-CM | POA: Diagnosis not present

## 2022-06-24 DIAGNOSIS — N3941 Urge incontinence: Secondary | ICD-10-CM

## 2022-06-24 DIAGNOSIS — C541 Malignant neoplasm of endometrium: Secondary | ICD-10-CM

## 2022-06-24 DIAGNOSIS — N819 Female genital prolapse, unspecified: Secondary | ICD-10-CM

## 2022-06-24 DIAGNOSIS — M85852 Other specified disorders of bone density and structure, left thigh: Secondary | ICD-10-CM

## 2022-06-24 NOTE — Progress Notes (Signed)
72 y.o. [id number] Married White or Caucasian female here for breast and pelvic exam.  I am also following her for h/o endometrial cancer.  She had anterior repair with sacrospinous ligament fixation, enterocele repair and cystoscopy.  Felt the recovery was a little more than she was prepared for but feels her bladder empties so much better.  Has a small amount of leaking but this is mild.  Uses mini pad only.  Has done PT in the past and knows to try and empty with regularity.    Dr. Chanetta Marshall prescribed trazodone for sleep.  Feels this has really helped.  Can sleep 6-7 hours as night.  She did have some post op bleeding but this has stopped.    Intercourse is better now.  She is using some vaginal estrogen cream but not as much as around the time of surgery.  Using miralax to help prevent constipation.    Calcium dosage discussed.  Vit D dosage discussed as well.    Patient's last menstrual period was 01/11/1998.          Sexually active: Yes.    H/O STD:  no  Health Maintenance: PCP:  Dr. Chanetta Marshall.  Last wellness appt was 07/2022.  Did blood work at that appt:  yes.  Does have appt in August.   Vaccines are up to date:  pt is unsure but will check with Dr. Chanetta Marshall in August Colonoscopy:  2016, follow up 10 years MMG:  10/29/2021 Negative BMD:  10/29/2021 Osteopenia -2.0 Last pap smear:  [id number].   H/o abnormal pap smear:  negative    reports that she has never smoked. She has never used smokeless tobacco. She reports that she does not drink alcohol and does not use drugs.  Past Medical History:  Diagnosis Date   Asthma    mild   Complex endometrial hyperplasia with atypia    Endometrioid adenocarcinoma    stage IA, grade 1   Hematuria 2016   saw urologist-? passed small stone   Hypercholesterolemia     Past Surgical History:  Procedure Laterality Date   ANTERIOR AND POSTERIOR REPAIR WITH SACROSPINOUS FIXATION N/A 02/01/2022   Procedure: ANTERIOR REPAIR WITH SACROSPINOUS  FIXATION;  Surgeon: Marguerita Beards, MD;  Location: [facility];  Service: Gynecology;  Laterality: N/A;  total time requested is 1.5 hrs   CHOLECYSTECTOMY  1972   CYSTOSCOPY N/A 02/01/2022   Procedure: CYSTOSCOPY;  Surgeon: Marguerita Beards, MD;  Location: [facility];  Service: Gynecology;  Laterality: N/A;   DILATION AND CURETTAGE OF UTERUS  10/2009   LAPAROSCOPIC TOTAL HYSTERECTOMY  12/2009   with BSO, pelvic LND   TUBAL LIGATION      Current Outpatient Medications  Medication Sig Dispense Refill   acetaminophen (TYLENOL) 500 MG tablet Take 1 tablet (500 mg total) by mouth every 6 (six) hours as needed (pain). 30 tablet 0   atorvastatin (LIPITOR) 20 MG tablet Take 20 mg by mouth daily at 6 PM.      BIOTIN PO Take 1 tablet by mouth daily.      Calcium Carb-Cholecalciferol 600-200 MG-UNIT TABS Take 1 tablet by mouth daily.     cetirizine (ZYRTEC) 5 MG tablet Take 5 mg by mouth daily as needed for allergies.     cholecalciferol (VITAMIN D) 1000 UNITS tablet Take 3,000 Units by mouth daily.      estradiol (ESTRACE) 0.1 MG/GM vaginal cream Place 0.5g nightly for two weeks then twice a week  after 30 g 11   ibuprofen (ADVIL) 600 MG tablet Take 1 tablet (600 mg total) by mouth every 6 (six) hours as needed. 30 tablet 0   polyethylene glycol powder (GLYCOLAX/MIRALAX) 17 GM/SCOOP powder Take 17 g by mouth daily. Drink 17g (1 scoop) dissolved in water per day. 255 g 0   Probiotic Product (PHILLIPS COLON HEALTH PO) Take 1 tablet by mouth daily.      simethicone (MYLICON) 80 MG chewable tablet Chew 80 mg by mouth every 6 (six) hours as needed for flatulence.     traZODone (DESYREL) 100 MG tablet Take 50 mg by mouth at bedtime.     No current facility-administered medications for this visit.    Family History  Problem Relation Age of Onset   Heart attack Mother    Hypertension Father    Hypertension Sister    Coronary artery disease Sister     Hypertension Brother    Coronary artery disease Brother    Diabetes Brother    Diabetes Maternal Aunt    Breast cancer Paternal Aunt    Coronary artery disease Maternal Grandfather    Diabetes Maternal Grandfather    Diabetes Paternal Grandmother    Diabetes Maternal Grandmother    Depression Maternal Grandmother    Diabetes Brother    Heart disease Brother     Review of Systems  Constitutional: Negative.   Genitourinary: Negative.     Exam:   BP 127/72 (BP Location: Right Arm, Patient Position: Sitting, Cuff Size: Normal)   Pulse (!) 58   Ht 5' 3.5" (1.613 m) Comment: reported  Wt 134 lb (60.8 kg)   LMP 01/11/1998   BMI 23.36 kg/m   Height: 5' 3.5" (161.3 cm) (reported)  General appearance: alert, cooperative and appears stated age Breasts: normal appearance, no masses or tenderness Abdomen: soft, non-tender; bowel sounds normal; no masses,  no organomegaly Lymph nodes: Cervical, supraclavicular, and axillary nodes normal.  No abnormal inguinal nodes palpated Neurologic: Grossly normal  Pelvic: External genitalia:  no lesions              Urethra:  normal appearing urethra with no masses, tenderness or lesions              Bartholins and Skenes: normal                 Vagina: normal appearing vagina with atrophic changes and no discharge, no lesions              Cervix: absent              Pap taken: No. Bimanual Exam:  Uterus:  uterus absent              Adnexa: no mass, fullness, tenderness               Rectovaginal: Confirms               Anus:  normal sphincter tone, no lesions  Chaperone, Ina Homes, CMA, was present for exam.  Assessment/Plan: 1. GYN exam for high-risk Medicare patient - Pap smear not indicated - Mammogram 10/2021 - Colonoscopy 2016, follow up 10 years - Bone mineral density 10/2021 - lab work done with PCP, Dr. Chanetta Marshall - vaccines reviewed/updated  2. Endometrial adenocarcinoma (HCC) - exam is normal today  3. Osteopenia of  necks of both femurs - calcium dosage and vit D dosages discussed - 30 minutes of weight bearing exercising recommended  4. Urge incontinence of urine -  does not feel she needs treatment at this time  5. Vaginal vault prolapse - s/p repair 01/2022 with Dr. Florian Buff  - does not need estrogen cream refill at this time.  Twice weekly dosage recommended.

## 2022-07-07 DIAGNOSIS — K080 Exfoliation of teeth due to systemic causes: Secondary | ICD-10-CM | POA: Diagnosis not present

## 2022-08-23 ENCOUNTER — Ambulatory Visit (INDEPENDENT_AMBULATORY_CARE_PROVIDER_SITE_OTHER): Payer: Medicare Other | Admitting: Obstetrics and Gynecology

## 2022-08-23 ENCOUNTER — Encounter: Payer: Self-pay | Admitting: Obstetrics and Gynecology

## 2022-08-23 VITALS — BP 118/78 | HR 64

## 2022-08-23 DIAGNOSIS — N362 Urethral caruncle: Secondary | ICD-10-CM

## 2022-08-23 NOTE — Progress Notes (Signed)
[facility] Return Visit  SUBJECTIVE  History of Present Illness: Haley Thompson is a 72 y.o. female seen in follow-up for vaginal bleeding.  Surgery: s/p anterior repair, sacrospinous ligament fixation, cystoscopy on 02/01/22   A few weeks ago, had some bleeding over 4 days. Noticed light spotting on her pad at first then increased to red with wiping. Then has had nothing since. It was not coming from her rectum. Has had some discomfort with sex. She was using the estrogen cream up until the episode. Uses a lubricant with intercourse.   Has not noticed a vaginal bulge.  Past Medical History: Patient  has a past medical history of Asthma, Complex endometrial hyperplasia with atypia, Endometrioid adenocarcinoma, Hematuria (2016), and Hypercholesterolemia.   Past Surgical History: She  has a past surgical history that includes Laparoscopic total hysterectomy (12/2009); Cholecystectomy (1972); Tubal ligation; Dilation and curettage of uterus (10/2009); Anterior and posterior repair with sacrospinous fixation (N/A, 02/01/2022); and Cystoscopy (N/A, 02/01/2022).   Medications: She has a current medication list which includes the following prescription(s): acetaminophen, atorvastatin, biotin, calcium carb-cholecalciferol, cetirizine, cholecalciferol, estradiol, ibuprofen, polyethylene glycol powder, probiotic product, simethicone, and trazodone.   Allergies: Patient has No Known Allergies.   Social History: Patient  reports that she has never smoked. She has never used smokeless tobacco. She reports that she does not drink alcohol and does not use drugs.      OBJECTIVE     Physical Exam: Vitals:   08/23/22 1155  BP: 118/78  Pulse: 64   Gen: No apparent distress, A&O x 3.  Detailed Urogynecologic Evaluation:  Small caruncle on urethra, no apparent bleeding present. Normal external genitalia. On speculum, normal vaginal mucosa. On bimanual, no masses present. No evidence  of prolapse.     ASSESSMENT AND PLAN    Haley Thompson is a 72 y.o. with:  1. Urethral caruncle    - Unclear cause of bleeding but urethral caruncle was noted today. We discussed that if she has bleeding again, will try to get her in as its happening to reexamine. Possible that the caruncle was causing some bleeding.  - For now, continue with estrace cream twice a week. Can also place some on the urethra.   Return as needed  Marguerita Beards, MD   Time spent: I spent 20 minutes dedicated to the care of this patient on the date of this encounter to include pre-visit review of records, face-to-face time with the patient and post visit documentation.

## 2022-09-14 DIAGNOSIS — Z23 Encounter for immunization: Secondary | ICD-10-CM | POA: Diagnosis not present

## 2022-09-14 DIAGNOSIS — Z79899 Other long term (current) drug therapy: Secondary | ICD-10-CM | POA: Diagnosis not present

## 2022-09-14 DIAGNOSIS — E559 Vitamin D deficiency, unspecified: Secondary | ICD-10-CM | POA: Diagnosis not present

## 2022-09-14 DIAGNOSIS — M8588 Other specified disorders of bone density and structure, other site: Secondary | ICD-10-CM | POA: Diagnosis not present

## 2022-09-14 DIAGNOSIS — E7800 Pure hypercholesterolemia, unspecified: Secondary | ICD-10-CM | POA: Diagnosis not present

## 2022-09-14 DIAGNOSIS — R7301 Impaired fasting glucose: Secondary | ICD-10-CM | POA: Diagnosis not present

## 2022-09-14 DIAGNOSIS — Z0000 Encounter for general adult medical examination without abnormal findings: Secondary | ICD-10-CM | POA: Diagnosis not present

## 2022-09-29 ENCOUNTER — Other Ambulatory Visit: Payer: Self-pay | Admitting: Family Medicine

## 2022-09-29 DIAGNOSIS — Z1231 Encounter for screening mammogram for malignant neoplasm of breast: Secondary | ICD-10-CM

## 2022-11-04 ENCOUNTER — Ambulatory Visit
Admission: RE | Admit: 2022-11-04 | Discharge: 2022-11-04 | Disposition: A | Discharge status: Home / Self Care | Payer: Medicare Other | Source: Ambulatory Visit | Attending: Family Medicine | Admitting: Family Medicine

## 2022-11-04 DIAGNOSIS — Z1231 Encounter for screening mammogram for malignant neoplasm of breast: Secondary | ICD-10-CM | POA: Diagnosis not present

## 2022-11-16 DIAGNOSIS — K080 Exfoliation of teeth due to systemic causes: Secondary | ICD-10-CM | POA: Diagnosis not present

## 2023-01-24 DIAGNOSIS — L821 Other seborrheic keratosis: Secondary | ICD-10-CM | POA: Diagnosis not present

## 2023-01-24 DIAGNOSIS — L308 Other specified dermatitis: Secondary | ICD-10-CM | POA: Diagnosis not present

## 2023-01-24 DIAGNOSIS — D1801 Hemangioma of skin and subcutaneous tissue: Secondary | ICD-10-CM | POA: Diagnosis not present

## 2023-02-17 DIAGNOSIS — K080 Exfoliation of teeth due to systemic causes: Secondary | ICD-10-CM | POA: Diagnosis not present

## 2023-03-21 DIAGNOSIS — K080 Exfoliation of teeth due to systemic causes: Secondary | ICD-10-CM | POA: Diagnosis not present

## 2023-03-29 DIAGNOSIS — H35363 Drusen (degenerative) of macula, bilateral: Secondary | ICD-10-CM | POA: Diagnosis not present

## 2023-03-29 DIAGNOSIS — H04123 Dry eye syndrome of bilateral lacrimal glands: Secondary | ICD-10-CM | POA: Diagnosis not present

## 2023-03-29 DIAGNOSIS — H5203 Hypermetropia, bilateral: Secondary | ICD-10-CM | POA: Diagnosis not present

## 2023-03-29 DIAGNOSIS — H524 Presbyopia: Secondary | ICD-10-CM | POA: Diagnosis not present

## 2023-04-04 DIAGNOSIS — H25813 Combined forms of age-related cataract, bilateral: Secondary | ICD-10-CM | POA: Diagnosis not present

## 2023-05-23 IMAGING — MG MM DIGITAL SCREENING BILAT W/ TOMO AND CAD
8 series · 8 of 24 positions shown · non-contrast
Comparison: Previous exam(s).

ACR Breast Density Category a: The breast tissue is almost entirely
fatty.

CLINICAL DATA: Screening.

EXAM:
DIGITAL SCREENING BILATERAL MAMMOGRAM WITH TOMOSYNTHESIS AND CAD
TECHNIQUE: Bilateral screening digital craniocaudal and mediolateral oblique
mammograms were obtained. Bilateral screening digital breast
tomosynthesis was performed. The images were evaluated with
computer-aided detection.

[L MLO synth-2D]
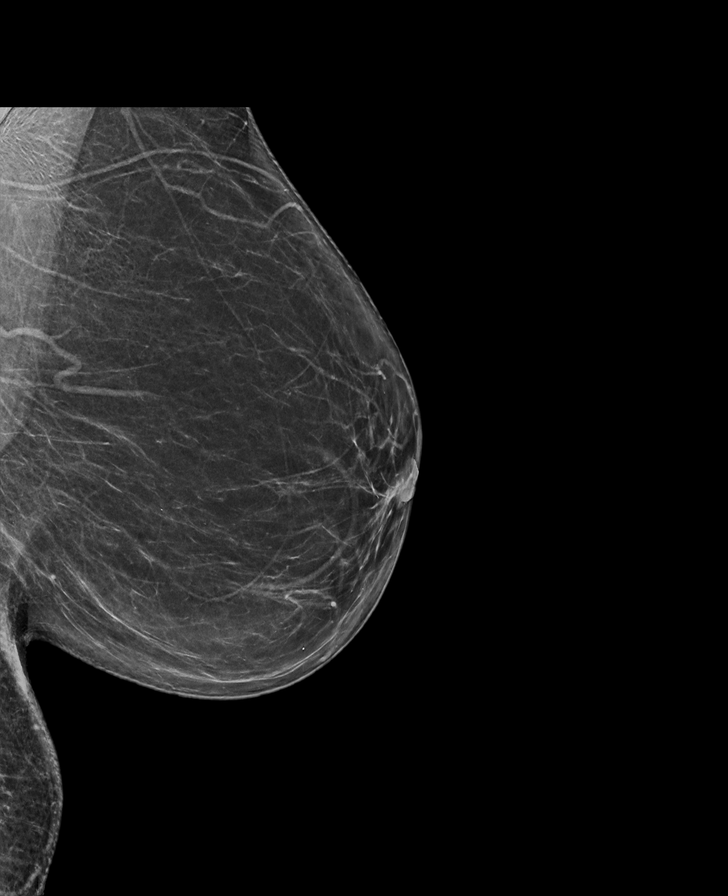

[R CC synth-2D]
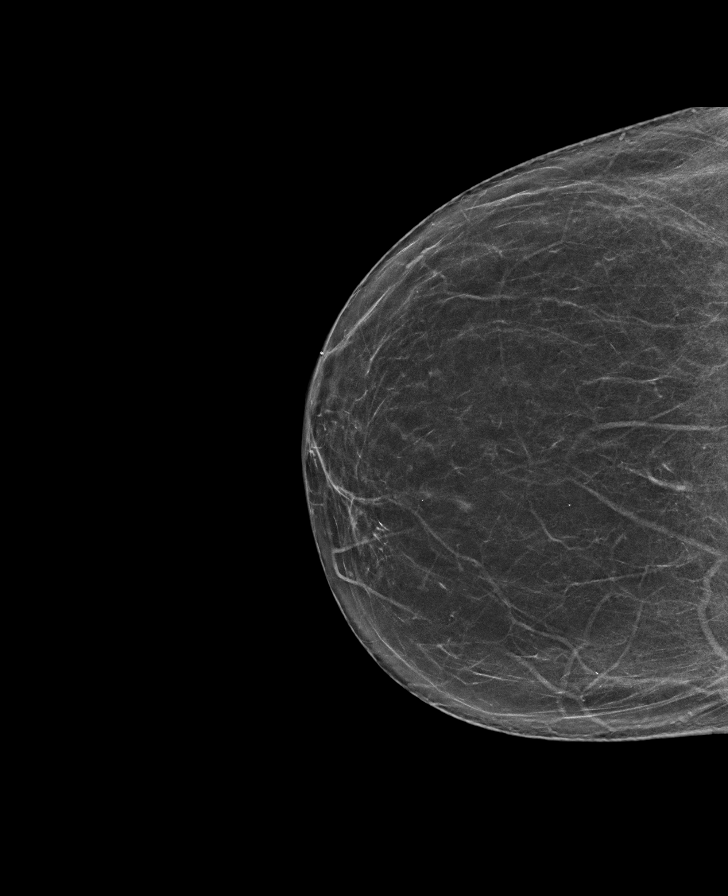

[L CC synth-2D]
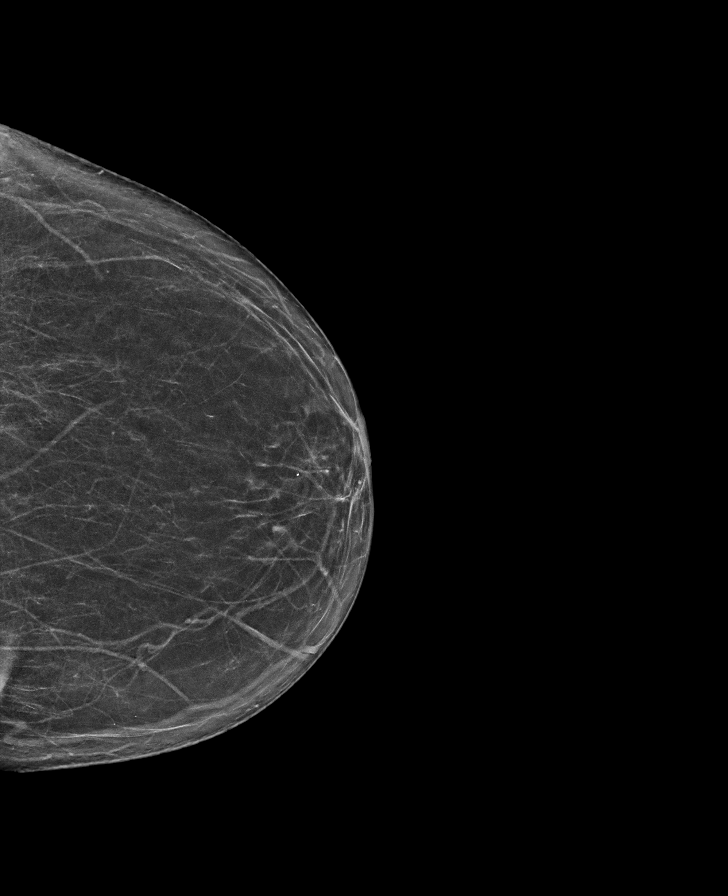

[R MLO synth-2D]
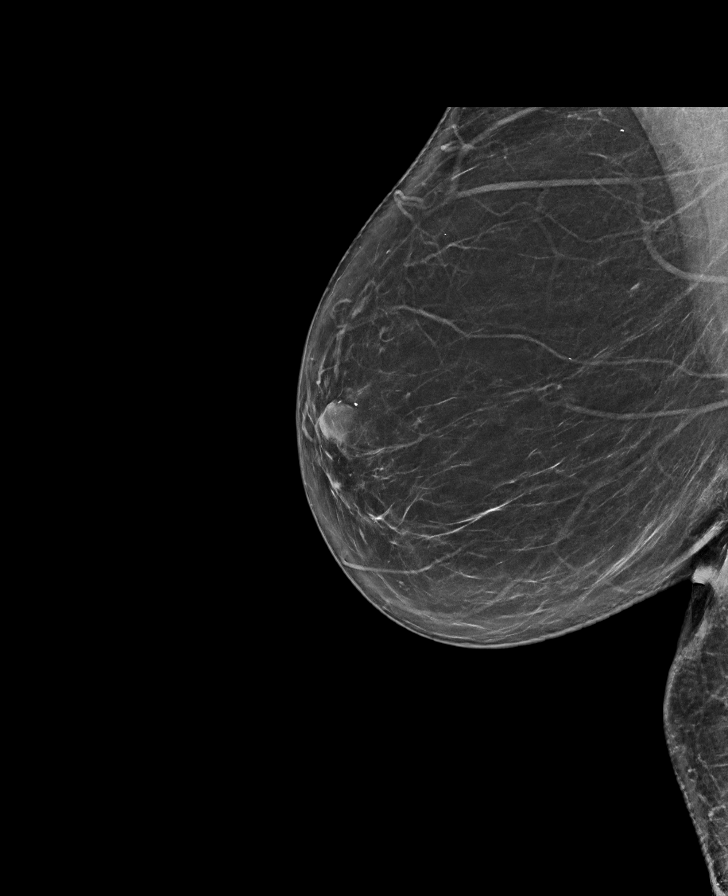

[R MLO tomo · tomo slice 40/79.0]
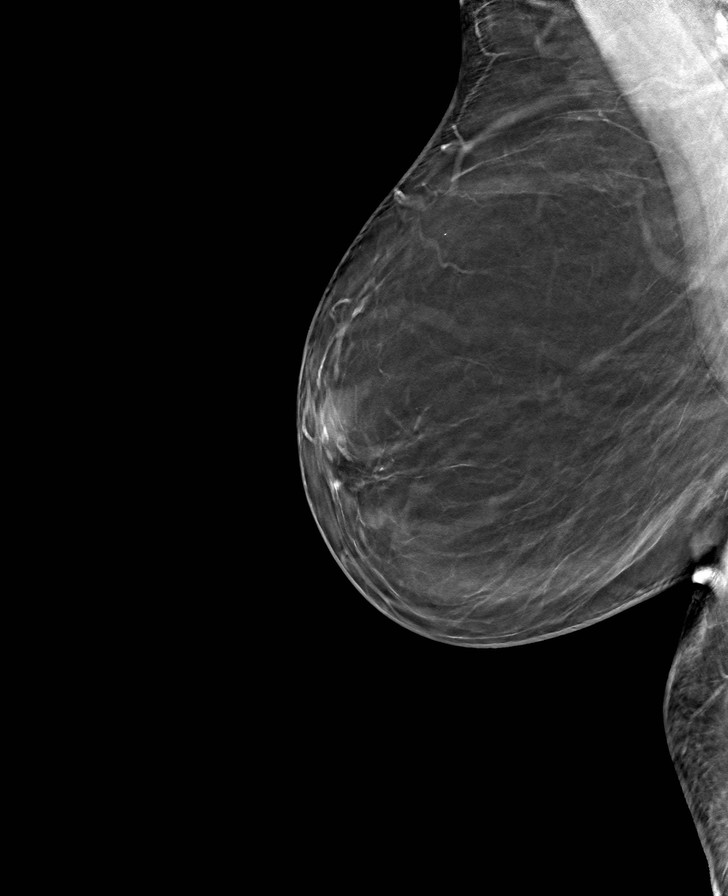

[L CC tomo · tomo slice 35/68.0]
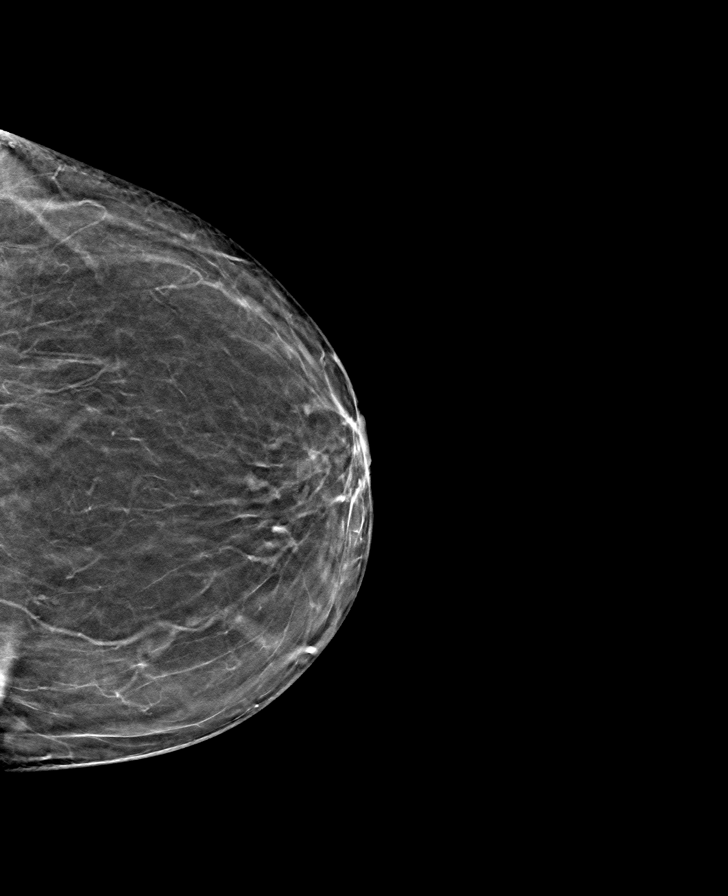

[L MLO tomo · tomo slice 38/75.0]
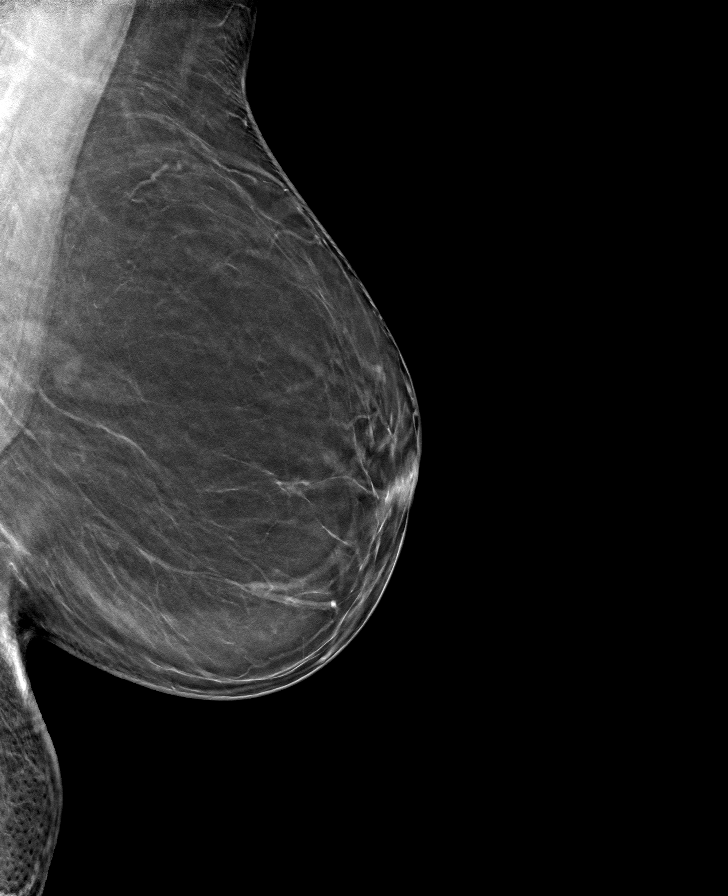

[R CC tomo · tomo slice 33/66.0]
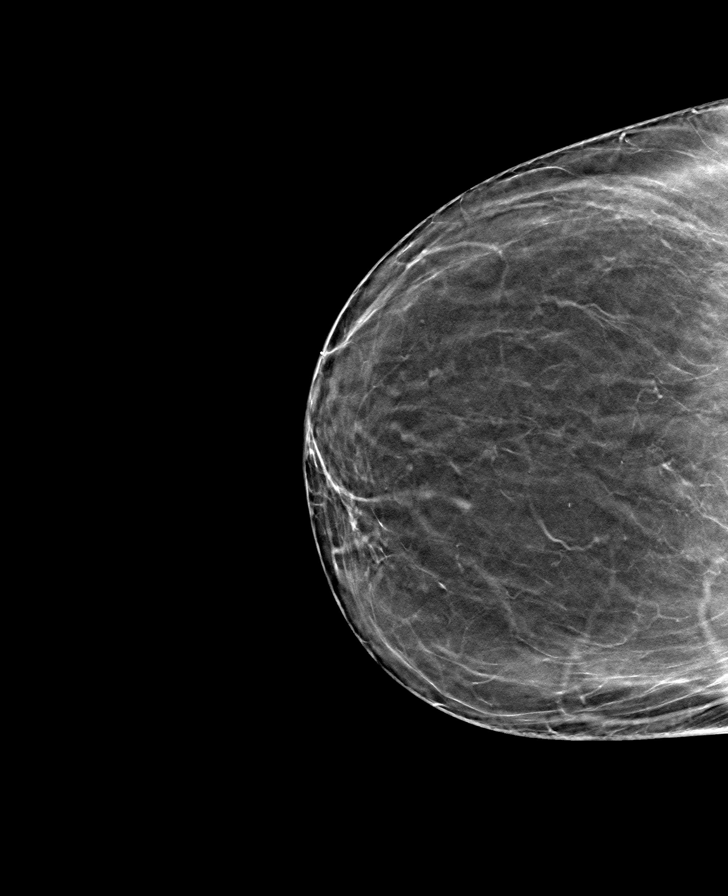

[8 of 24 positions shown; findings below may reference images not displayed]

FINDINGS: There are no findings suspicious for malignancy.
IMPRESSION: No mammographic evidence of malignancy. A result letter of this
screening mammogram will be mailed directly to the patient.

RECOMMENDATION:
Screening mammogram in one year. (Code:0E-3-N98)

BI-RADS CATEGORY  1: Negative.

## 2023-05-31 DIAGNOSIS — Z01818 Encounter for other preprocedural examination: Secondary | ICD-10-CM | POA: Diagnosis not present

## 2023-06-02 DIAGNOSIS — H25812 Combined forms of age-related cataract, left eye: Secondary | ICD-10-CM | POA: Diagnosis not present

## 2023-06-02 DIAGNOSIS — J45909 Unspecified asthma, uncomplicated: Secondary | ICD-10-CM | POA: Diagnosis not present

## 2023-06-16 DIAGNOSIS — H25811 Combined forms of age-related cataract, right eye: Secondary | ICD-10-CM | POA: Diagnosis not present

## 2023-06-20 DIAGNOSIS — K080 Exfoliation of teeth due to systemic causes: Secondary | ICD-10-CM | POA: Diagnosis not present

## 2023-07-07 ENCOUNTER — Ambulatory Visit (HOSPITAL_BASED_OUTPATIENT_CLINIC_OR_DEPARTMENT_OTHER): Payer: Medicare Other | Admitting: Obstetrics & Gynecology

## 2023-08-02 DIAGNOSIS — L03031 Cellulitis of right toe: Secondary | ICD-10-CM | POA: Diagnosis not present

## 2023-08-02 DIAGNOSIS — B351 Tinea unguium: Secondary | ICD-10-CM | POA: Diagnosis not present

## 2023-08-16 DIAGNOSIS — L03031 Cellulitis of right toe: Secondary | ICD-10-CM | POA: Diagnosis not present

## 2023-08-16 DIAGNOSIS — B351 Tinea unguium: Secondary | ICD-10-CM | POA: Diagnosis not present

## 2023-08-17 DIAGNOSIS — L820 Inflamed seborrheic keratosis: Secondary | ICD-10-CM | POA: Diagnosis not present

## 2023-08-17 DIAGNOSIS — L570 Actinic keratosis: Secondary | ICD-10-CM | POA: Diagnosis not present

## 2023-08-29 ENCOUNTER — Ambulatory Visit (HOSPITAL_BASED_OUTPATIENT_CLINIC_OR_DEPARTMENT_OTHER): Admitting: Obstetrics & Gynecology

## 2023-09-13 ENCOUNTER — Ambulatory Visit (HOSPITAL_BASED_OUTPATIENT_CLINIC_OR_DEPARTMENT_OTHER): Admitting: Obstetrics & Gynecology

## 2023-09-22 DIAGNOSIS — R7301 Impaired fasting glucose: Secondary | ICD-10-CM | POA: Diagnosis not present

## 2023-09-22 DIAGNOSIS — E7800 Pure hypercholesterolemia, unspecified: Secondary | ICD-10-CM | POA: Diagnosis not present

## 2023-09-22 DIAGNOSIS — Z79899 Other long term (current) drug therapy: Secondary | ICD-10-CM | POA: Diagnosis not present

## 2023-09-22 DIAGNOSIS — Z23 Encounter for immunization: Secondary | ICD-10-CM | POA: Diagnosis not present

## 2023-09-22 DIAGNOSIS — Z0000 Encounter for general adult medical examination without abnormal findings: Secondary | ICD-10-CM | POA: Diagnosis not present

## 2023-09-22 DIAGNOSIS — E559 Vitamin D deficiency, unspecified: Secondary | ICD-10-CM | POA: Diagnosis not present

## 2023-09-22 DIAGNOSIS — M8588 Other specified disorders of bone density and structure, other site: Secondary | ICD-10-CM | POA: Diagnosis not present

## 2023-09-27 ENCOUNTER — Other Ambulatory Visit (HOSPITAL_BASED_OUTPATIENT_CLINIC_OR_DEPARTMENT_OTHER): Payer: Self-pay | Admitting: Family Medicine

## 2023-09-27 DIAGNOSIS — M8588 Other specified disorders of bone density and structure, other site: Secondary | ICD-10-CM

## 2023-09-30 DIAGNOSIS — Z1211 Encounter for screening for malignant neoplasm of colon: Secondary | ICD-10-CM | POA: Diagnosis not present

## 2023-09-30 DIAGNOSIS — K5900 Constipation, unspecified: Secondary | ICD-10-CM | POA: Diagnosis not present

## 2023-10-04 ENCOUNTER — Other Ambulatory Visit: Payer: Self-pay | Admitting: Family Medicine

## 2023-10-04 DIAGNOSIS — Z1231 Encounter for screening mammogram for malignant neoplasm of breast: Secondary | ICD-10-CM

## 2023-10-10 ENCOUNTER — Ambulatory Visit (HOSPITAL_BASED_OUTPATIENT_CLINIC_OR_DEPARTMENT_OTHER): Admitting: Obstetrics & Gynecology

## 2023-11-04 ENCOUNTER — Encounter (HOSPITAL_BASED_OUTPATIENT_CLINIC_OR_DEPARTMENT_OTHER): Payer: Self-pay | Admitting: Obstetrics & Gynecology

## 2023-11-04 ENCOUNTER — Ambulatory Visit (INDEPENDENT_AMBULATORY_CARE_PROVIDER_SITE_OTHER): Admitting: Obstetrics & Gynecology

## 2023-11-04 VITALS — BP 114/73 | HR 61 | Wt 135.8 lb

## 2023-11-04 DIAGNOSIS — Z9189 Other specified personal risk factors, not elsewhere classified: Secondary | ICD-10-CM

## 2023-11-04 DIAGNOSIS — M85852 Other specified disorders of bone density and structure, left thigh: Secondary | ICD-10-CM | POA: Diagnosis not present

## 2023-11-04 DIAGNOSIS — C541 Malignant neoplasm of endometrium: Secondary | ICD-10-CM

## 2023-11-04 DIAGNOSIS — M85851 Other specified disorders of bone density and structure, right thigh: Secondary | ICD-10-CM | POA: Diagnosis not present

## 2023-11-04 DIAGNOSIS — Z1331 Encounter for screening for depression: Secondary | ICD-10-CM

## 2023-11-04 DIAGNOSIS — N3941 Urge incontinence: Secondary | ICD-10-CM

## 2023-11-04 MED ORDER — ESTRADIOL 0.01 % VA CREA: TOPICAL_CREAM | VAGINAL | 2 refills | Status: AC

## 2023-11-04 NOTE — Progress Notes (Signed)
 Breast and Pelvic Exam Patient name: Haley Thompson MRN [id number]  Date of birth: April 23, 1950 Chief Complaint:   Breast and Pelvic Exam, h/o endometrial cancer  History of Present Illness:   Haley Thompson is a 73 y.o. [id number] Caucasian female being seen today for breast and pelvic exam.  Moving to [place]  in the new year.  Has family new [employer], [place].  Doing well.  Denies vaginal bleeding.    Patient's last menstrual period was 01/11/1998.  Last pap 04/25/2014. Results were: NILM w/ HRHPV not done. H/O abnormal pap: no Last mammogram: 11/04/2022. Results were: normal. Next scheduled for 11/17/2023. Family h/o breast cancer: yes paternal aunt Last colonoscopy: 10/2013. Results were: normal. Scheduled for next colonoscopy on 11/15/2023 with [facility].  Family h/o colorectal cancer: no Dexa:  10/2021.  T score -2.4       11/04/2023    8:53 AM 06/24/2022    1:52 PM 05/14/2021    2:53 PM  Depression screen PHQ 2/9  Decreased Interest 0 0 0  Down, Depressed, Hopeless 0 0 0  PHQ - 2 Score 0 0 0        11/04/2023    8:54 AM  GAD 7 : Generalized Anxiety Score  Nervous, Anxious, on Edge 0  Control/stop worrying 1  Worry too much - different things 0  Trouble relaxing 0  Restless 0  Easily annoyed or irritable 0  Afraid - awful might happen 0  Total GAD 7 Score 1  Anxiety Difficulty Not difficult at all    Review of Systems:   Pertinent items are noted in HPI Denies any urinary or bowel changes or pelvic pain.   Pertinent History Reviewed:  Reviewed past medical,surgical, social and family history.  Reviewed problem list, medications and allergies. Physical Assessment:   Vitals:   11/04/23 0844  BP: 114/73  Pulse: 61  SpO2: 100%  Weight: 135 lb 12.8 oz (61.6 kg)  Body mass index is 23.68 kg/m.        Physical Examination:   General appearance - well appearing, and in no distress  Mental status - alert, oriented to person, place, and time  Psych:  She has a  normal mood and affect  Skin - warm and dry, normal color, no suspicious lesions noted  Chest - effort normal, all lung fields clear to auscultation bilaterally  Heart - normal rate and regular rhythm  Neck:  midline trachea, no thyromegaly or nodules  Breasts - breasts appear normal, no suspicious masses, no skin or nipple changes or  axillary nodes  Abdomen - soft, nontender, nondistended, no masses or organomegaly  Pelvic - VULVA: normal appearing vulva with no masses, tenderness or lesions   VAGINA: normal appearing vagina with normal color and discharge, no lesions   CERVIX: surgically absent  Thin prep pap is not indicated  UTERUS: surgically absent  ADNEXA: No adnexal masses or tenderness noted.  Rectal - normal rectal, good sphincter tone, no masses felt.   Extremities:  No swelling or varicosities noted  Chaperone present for exam  No results found for this or any previous visit (from the past 24 hours).  Assessment & Plan:  1. GYN exam for high-risk Medicare patient (Primary) - Pap smear not indicated - Mammogram scheduled 11/17/2023 - Colonoscopy scheduled 11/2023 - Bone mineral density ordered for her to do in [place] - lab work done with PCP, Dr. Chrystal - vaccines reviewed/updated  2. Osteopenia of both hips - on Vit D and  calcium - DG Bone Density; Future  3. Endometrial adenocarcinoma (HCC) - normal exam today.  Yearly follow up recommended.    4. Urge incontinence - estradiol  (ESTRACE ) 0.01 % CREA vaginal cream; 1 gram vaginally twice weekly  Dispense: 42.5 g; Refill: 2   Orders Placed This Encounter  Procedures   DG Bone Density    Meds: No orders of the defined types were placed in this encounter.   Follow-up: none due to upcoming move to [place]   Ronal GORMAN Pinal, MD 11/04/2023 9:41 AM

## 2023-11-04 NOTE — Patient Instructions (Signed)
 [facility]      [address], [place], [place] [address]  16 mi  [phone]

## 2023-11-15 DIAGNOSIS — D123 Benign neoplasm of transverse colon: Secondary | ICD-10-CM | POA: Diagnosis not present

## 2023-11-15 DIAGNOSIS — K648 Other hemorrhoids: Secondary | ICD-10-CM | POA: Diagnosis not present

## 2023-11-15 DIAGNOSIS — D122 Benign neoplasm of ascending colon: Secondary | ICD-10-CM | POA: Diagnosis not present

## 2023-11-15 DIAGNOSIS — Z1211 Encounter for screening for malignant neoplasm of colon: Secondary | ICD-10-CM | POA: Diagnosis not present

## 2023-11-15 DIAGNOSIS — K5730 Diverticulosis of large intestine without perforation or abscess without bleeding: Secondary | ICD-10-CM | POA: Diagnosis not present

## 2023-11-17 ENCOUNTER — Ambulatory Visit
Admission: RE | Admit: 2023-11-17 | Discharge: 2023-11-17 | Disposition: A | Discharge status: Home / Self Care | Source: Ambulatory Visit | Attending: Family Medicine | Admitting: Family Medicine

## 2023-11-17 DIAGNOSIS — D122 Benign neoplasm of ascending colon: Secondary | ICD-10-CM | POA: Diagnosis not present

## 2023-11-17 DIAGNOSIS — Z1231 Encounter for screening mammogram for malignant neoplasm of breast: Secondary | ICD-10-CM | POA: Diagnosis not present

## 2023-11-17 DIAGNOSIS — D123 Benign neoplasm of transverse colon: Secondary | ICD-10-CM | POA: Diagnosis not present

## 2023-11-22 DIAGNOSIS — K080 Exfoliation of teeth due to systemic causes: Secondary | ICD-10-CM | POA: Diagnosis not present

## 2023-11-29 ENCOUNTER — Ambulatory Visit
Admission: RE | Admit: 2023-11-29 | Discharge: 2023-11-29 | Disposition: A | Discharge status: Home / Self Care | Source: Ambulatory Visit | Attending: Obstetrics & Gynecology | Admitting: Obstetrics & Gynecology

## 2023-11-29 ENCOUNTER — Ambulatory Visit (HOSPITAL_BASED_OUTPATIENT_CLINIC_OR_DEPARTMENT_OTHER): Payer: Self-pay | Admitting: Obstetrics & Gynecology

## 2023-11-29 DIAGNOSIS — M85852 Other specified disorders of bone density and structure, left thigh: Secondary | ICD-10-CM | POA: Insufficient documentation

## 2023-11-29 DIAGNOSIS — M85851 Other specified disorders of bone density and structure, right thigh: Secondary | ICD-10-CM | POA: Insufficient documentation

## 2023-11-29 DIAGNOSIS — M810 Age-related osteoporosis without current pathological fracture: Secondary | ICD-10-CM | POA: Diagnosis not present

## 2023-11-29 DIAGNOSIS — Z780 Asymptomatic menopausal state: Secondary | ICD-10-CM | POA: Diagnosis not present
